# Patient Record
Sex: Male | Born: 1970
Health system: Southern US, Community
[De-identification: ages and names within clinical notes are randomized; demographics above are authoritative.]

## PROBLEM LIST (undated history)

## (undated) DIAGNOSIS — C4491 Basal cell carcinoma of skin, unspecified: Secondary | ICD-10-CM

## (undated) DIAGNOSIS — I10 Essential (primary) hypertension: Secondary | ICD-10-CM

## (undated) DIAGNOSIS — D039 Melanoma in situ, unspecified: Secondary | ICD-10-CM

## (undated) DIAGNOSIS — C4492 Squamous cell carcinoma of skin, unspecified: Secondary | ICD-10-CM

## (undated) DIAGNOSIS — Z8619 Personal history of other infectious and parasitic diseases: Secondary | ICD-10-CM

## (undated) HISTORY — DX: Personal history of other infectious and parasitic diseases: Z86.19

## (undated) HISTORY — DX: Melanoma in situ, unspecified: D03.9

## (undated) HISTORY — PX: WISDOM TOOTH EXTRACTION: SHX21

## (undated) HISTORY — PX: KNEE SURGERY: SHX244

## (undated) HISTORY — DX: Squamous cell carcinoma of skin, unspecified: C44.92

## (undated) HISTORY — PX: SHOULDER SURGERY: SHX246

## (undated) HISTORY — DX: Basal cell carcinoma of skin, unspecified: C44.91

## (undated) HISTORY — DX: Essential (primary) hypertension: I10

---

## 2012-09-05 ENCOUNTER — Encounter (HOSPITAL_COMMUNITY): Payer: Self-pay | Admitting: *Deleted

## 2012-09-05 ENCOUNTER — Emergency Department (HOSPITAL_COMMUNITY): Payer: BC Managed Care – PPO

## 2012-09-05 ENCOUNTER — Emergency Department (HOSPITAL_COMMUNITY)
Admission: EM | Admit: 2012-09-05 | Discharge: 2012-09-05 | Disposition: A | Discharge status: Home / Self Care | Payer: BC Managed Care – PPO | Attending: Emergency Medicine | Admitting: Emergency Medicine

## 2012-09-05 DIAGNOSIS — R42 Dizziness and giddiness: Secondary | ICD-10-CM | POA: Insufficient documentation

## 2012-09-05 DIAGNOSIS — R739 Hyperglycemia, unspecified: Secondary | ICD-10-CM

## 2012-09-05 DIAGNOSIS — R110 Nausea: Secondary | ICD-10-CM | POA: Insufficient documentation

## 2012-09-05 DIAGNOSIS — M549 Dorsalgia, unspecified: Secondary | ICD-10-CM | POA: Insufficient documentation

## 2012-09-05 DIAGNOSIS — R61 Generalized hyperhidrosis: Secondary | ICD-10-CM | POA: Insufficient documentation

## 2012-09-05 DIAGNOSIS — R093 Abnormal sputum: Secondary | ICD-10-CM | POA: Insufficient documentation

## 2012-09-05 DIAGNOSIS — R079 Chest pain, unspecified: Secondary | ICD-10-CM

## 2012-09-05 DIAGNOSIS — R05 Cough: Secondary | ICD-10-CM | POA: Insufficient documentation

## 2012-09-05 DIAGNOSIS — M542 Cervicalgia: Secondary | ICD-10-CM | POA: Insufficient documentation

## 2012-09-05 DIAGNOSIS — R059 Cough, unspecified: Secondary | ICD-10-CM | POA: Insufficient documentation

## 2012-09-05 DIAGNOSIS — R55 Syncope and collapse: Secondary | ICD-10-CM

## 2012-09-05 DIAGNOSIS — R209 Unspecified disturbances of skin sensation: Secondary | ICD-10-CM | POA: Insufficient documentation

## 2012-09-05 DIAGNOSIS — IMO0001 Reserved for inherently not codable concepts without codable children: Secondary | ICD-10-CM | POA: Insufficient documentation

## 2012-09-05 LAB — BASIC METABOLIC PANEL
BUN: 14 mg/dL (ref 6–23)
CO2: 23 mEq/L (ref 19–32)
Calcium: 9.8 mg/dL (ref 8.4–10.5)
Chloride: 102 mEq/L (ref 96–112)
Creatinine, Ser: 1.18 mg/dL (ref 0.50–1.35)
GFR calc Af Amer: 88 mL/min — ABNORMAL LOW (ref >90)
GFR calc non Af Amer: 76 mL/min — ABNORMAL LOW (ref >90)
Glucose, Bld: 199 mg/dL — ABNORMAL HIGH (ref 70–99)
Potassium: 3.6 mEq/L (ref 3.5–5.1)
Sodium: 137 mEq/L (ref 135–145)

## 2012-09-05 LAB — CBC WITH DIFFERENTIAL/PLATELET
Basophils Absolute: 0 10*3/uL (ref 0.0–0.1)
Basophils Relative: 0 % (ref 0–1)
Eosinophils Absolute: 0 10*3/uL (ref 0.0–0.7)
Eosinophils Relative: 0 % (ref 0–5)
HCT: 44.2 % (ref 39.0–52.0)
Hemoglobin: 15.2 g/dL (ref 13.0–17.0)
Lymphocytes Relative: 35 % (ref 12–46)
Lymphs Abs: 2.9 10*3/uL (ref 0.7–4.0)
MCH: 31.3 pg (ref 26.0–34.0)
MCHC: 34.4 g/dL (ref 30.0–36.0)
MCV: 90.9 fL (ref 78.0–100.0)
Monocytes Absolute: 0.4 10*3/uL (ref 0.1–1.0)
Monocytes Relative: 5 % (ref 3–12)
Neutro Abs: 4.9 10*3/uL (ref 1.7–7.7)
Neutrophils Relative %: 60 % (ref 43–77)
Platelets: 245 10*3/uL (ref 150–400)
RBC: 4.86 MIL/uL (ref 4.22–5.81)
RDW: 12.3 % (ref 11.5–15.5)
WBC: 8.3 10*3/uL (ref 4.0–10.5)

## 2012-09-05 LAB — TROPONIN I: Troponin I: <0.3 ng/mL (ref <0.30)

## 2012-09-05 MED ORDER — SODIUM CHLORIDE 0.9 % IV SOLN
INTRAVENOUS | Status: DC
  Administered 2012-09-05: 13:00:00 via INTRAVENOUS

## 2012-09-05 MED ORDER — SODIUM CHLORIDE 0.9 % IV BOLUS (SEPSIS)
500.0000 mL | Freq: Once | INTRAVENOUS | Status: AC
  Administered 2012-09-05: 1000 mL via INTRAVENOUS

## 2012-09-05 MED ORDER — ASPIRIN 81 MG PO CHEW
324.0000 mg | CHEWABLE_TABLET | Freq: Once | ORAL | Status: AC
  Administered 2012-09-05: 324 mg via ORAL
  Filled 2012-09-05: qty 4

## 2012-09-05 MED ORDER — ONDANSETRON HCL 4 MG/2ML IJ SOLN
4.0000 mg | Freq: Once | INTRAMUSCULAR | Status: AC
  Administered 2012-09-05: 4 mg via INTRAVENOUS
  Filled 2012-09-05: qty 2

## 2012-09-05 MED ORDER — IOHEXOL 350 MG/ML SOLN
100.0000 mL | Freq: Once | INTRAVENOUS | Status: AC | PRN
  Administered 2012-09-05: 100 mL via INTRAVENOUS

## 2012-09-05 NOTE — ED Notes (Signed)
Discharge instructions discussed with pt and wife, verbalized understanding of all, no scripts given. Ambulated out without difficulty.

## 2012-09-05 NOTE — ED Notes (Signed)
Pt reports near syncopal episode while at work today, thinks may be getting a chest cold, chest feels tight, ?chest pain at times, is pain free at arrival. Denies any sob, reports being under a lot of stress at work. No fever. Or cough. No dizziness or chills

## 2012-09-05 NOTE — ED Provider Notes (Addendum)
History    This chart was scribed for Shelda Jakes, MD by Sofie Rower, ED Scribe. The patient was seen in room APA02/AP[facility]02 and the patient's care was started at 11:15AM.    CSN: [id number]  Arrival date & time 09/05/12  1013   First MD Initiated Contact with Patient 09/05/12 1115      Chief Complaint  Patient presents with  . Chest Pain    (Consider location/radiation/quality/duration/timing/severity/associated sxs/prior treatment) Patient is a 42 y.o. male presenting with chest pain. The history is provided by the patient and the spouse. No language interpreter was used.  Chest Pain Pain location:  Substernal area and L chest Pain radiates to:  Upper back Pain radiates to the back: yes   Pain severity:  Moderate Onset quality:  Sudden Timing:  Intermittent Progression:  Worsening Chronicity:  New Relieved by:  Nothing Worsened by:  Nothing tried Ineffective treatments:  None tried Associated symptoms: back pain, cough, diaphoresis, nausea and numbness   Associated symptoms: no abdominal pain, no fever, no headache, no shortness of breath and not vomiting     Kurt Holmes is a 42 y.o. male , with a hx of shoulder and knee surgery, who presents to the Emergency Department complaining of intermittent, progressively worsening, chest pain located at the left sub sternal chest, radiating towards the back, onset yesterday (09/04/12)  Associated symptoms include light headedness, myalgias, back pain, neck pain, nausea, chills, sweats, tingling in the bilateral upper extremities, cough, and near syncope. The pt reports the chest pain he is experiencing is a feeling of discomfort, almost as if he has pulled a muscle. While on a ladder at work this morning, the pt suddenly became sick to his stomach, and almost passed out, which prompted his concern and desire to seek medical evaluation at [facility] this morning.   The pt denies headache, SOB, abdominal pain, vomiting, dysuria, fevers, and  rash. Furthremore, the pt denies any hx of bleeding easily.   The pt does not smoke or drink alcohol.     History reviewed. No pertinent past medical history.  Past Surgical History  Procedure Laterality Date  . Shoulder surgery    . Knee surgery      No family history on file.  History  Substance Use Topics  . Smoking status: Never Smoker   . Smokeless tobacco: Not on file  . Alcohol Use: No      Review of Systems  Constitutional: Positive for chills and diaphoresis. Negative for fever.  HENT: Positive for neck pain.   Eyes: Negative for visual disturbance.  Respiratory: Positive for cough. Negative for shortness of breath.   Cardiovascular: Positive for chest pain.  Gastrointestinal: Positive for nausea. Negative for vomiting and abdominal pain.  Genitourinary: Negative for dysuria.  Musculoskeletal: Positive for myalgias and back pain.  Skin: Negative for rash.  Neurological: Positive for syncope, light-headedness and numbness. Negative for headaches.  Hematological: Does not bruise/bleed easily.  All other systems reviewed and are negative.    Allergies  Review of patient's allergies indicates no known allergies.  Home Medications   Current Outpatient Rx  Name  Route  Sig  Dispense  Refill  . guaiFENesin-dextromethorphan (ROBITUSSIN DM) 100-10 MG/5ML syrup   Oral   Take 10 mLs by mouth 3 (three) times daily as needed for cough.           BP 137/87  Pulse 91  Temp(Src) 98.3 F (36.8 C) (Oral)  Resp 16  Ht 5\' 6"  (1.676  m)  Wt 170 lb (77.111 kg)  BMI 27.45 kg/m2  SpO2 96%  Physical Exam  Nursing note and vitals reviewed. Constitutional: He is oriented to person, place, and time. He appears well-developed and well-nourished. No distress.  HENT:  Head: Normocephalic and atraumatic.  Mouth/Throat: Oropharynx is clear and moist.  Eyes: Conjunctivae and EOM are normal. Pupils are equal, round, and reactive to light. No scleral icterus.  Neck: Neck  supple. No tracheal deviation present.  Cardiovascular: Normal rate, regular rhythm and normal heart sounds.   No murmur heard. Pulmonary/Chest: Effort normal and breath sounds normal. No respiratory distress. He has no wheezes.  Abdominal: Soft. Bowel sounds are normal. He exhibits no distension. There is no tenderness.  Musculoskeletal: Normal range of motion. He exhibits no edema.  Lymphadenopathy:    He has no cervical adenopathy.  Neurological: He is alert and oriented to person, place, and time. No cranial nerve deficit or sensory deficit.  Skin: Skin is warm and dry.  Psychiatric: He has a normal mood and affect. His behavior is normal.    ED Course  Procedures (including critical care time)  DIAGNOSTIC STUDIES: Oxygen Saturation is 96% on room air, normal by my interpretation.    COORDINATION OF CARE:   11:40 AM- Treatment plan discussed with patient. Pt agrees with treatment.     Results for orders placed during the hospital encounter of 09/05/12  CBC WITH DIFFERENTIAL      Result Value Range   WBC 8.3  4.0 - 10.5 K/uL   RBC 4.86  4.22 - 5.81 MIL/uL   Hemoglobin 15.2  13.0 - 17.0 g/dL   HCT 21.3  08.6 - 57.8 %   MCV 90.9  78.0 - 100.0 fL   MCH 31.3  26.0 - 34.0 pg   MCHC 34.4  30.0 - 36.0 g/dL   RDW 46.9  62.9 - 52.8 %   Platelets 245  150 - 400 K/uL   Neutrophils Relative 60  43 - 77 %   Neutro Abs 4.9  1.7 - 7.7 K/uL   Lymphocytes Relative 35  12 - 46 %   Lymphs Abs 2.9  0.7 - 4.0 K/uL   Monocytes Relative 5  3 - 12 %   Monocytes Absolute 0.4  0.1 - 1.0 K/uL   Eosinophils Relative 0  0 - 5 %   Eosinophils Absolute 0.0  0.0 - 0.7 K/uL   Basophils Relative 0  0 - 1 %   Basophils Absolute 0.0  0.0 - 0.1 K/uL  BASIC METABOLIC PANEL      Result Value Range   Sodium 137  135 - 145 mEq/L   Potassium 3.6  3.5 - 5.1 mEq/L   Chloride 102  96 - 112 mEq/L   CO2 23  19 - 32 mEq/L   Glucose, Bld 199 (*) 70 - 99 mg/dL   BUN 14  6 - 23 mg/dL   Creatinine, Ser 4.13   0.50 - 1.35 mg/dL   Calcium 9.8  8.4 - 24.4 mg/dL   GFR calc non Af Amer 76 (*) >90 mL/min   GFR calc Af Amer 88 (*) >90 mL/min  TROPONIN I      Result Value Range   Troponin I <0.30  <0.30 ng/mL   Dg Chest Portable 1 View  09/05/2012  *RADIOLOGY REPORT*  Clinical Data: Anterior and posterior chest pain, former smoking history  PORTABLE CHEST - 1 VIEW  Comparison: None.  Findings: No active infiltrate or effusion  is seen.  Mediastinal contours appear normal.  The heart is within normal limits in size. No skeletal abnormality is seen.  IMPRESSION: No active lung disease.   Original Report Authenticated By: Dwyane Dee, M.D.      Date: 09/05/2012  Rate: 91  Rhythm: normal sinus rhythm  QRS Axis: normal  Intervals: normal  ST/T Wave abnormalities: normal  Conduction Disutrbances:none  Narrative Interpretation:   Old EKG Reviewed: none available   1. Near syncope   2. Hyperglycemia   3. Chest pain       MDM   Workup for the near syncope and the intermittent chest pain mild since yesterday has been negative. With the exception that your blood sugar was elevated will need followup to make sure that this is not a borderline diabetic situation. CT chest negative for pulmonary embolism. Negative for pneumonia or pneumothorax. Recommend followup with cardiology referral provided. Also recommend starting a baby aspirin a day. Patient will return for any newer worse symptoms.      I personally performed the services described in this documentation, which was scribed in my presence. The recorded information has been reviewed and is accurate.     Shelda Jakes, MD 09/05/12 1341  Shelda Jakes, MD 09/05/12 1346  Shelda Jakes, MD 09/05/12 1416

## 2012-09-05 NOTE — ED Notes (Signed)
Pt given ice chips, requesting information about labs and  Ct results, md notified. Stated he will see pt asap.

## 2012-09-05 NOTE — ED Notes (Signed)
Pt states he was at work and became nauseated and began feeling like he was going to pass out. States everything went black but did not fall. Pain to center chest, radiating to back, described as "like a pulled muscle". NAD.

## 2012-09-05 NOTE — ED Notes (Signed)
Pt resting in bed, voices no complaints,

## 2014-05-28 IMAGING — CT CT ANGIO CHEST
1 of 6 series · 5 of 36 positions shown · IV contrast (Omnipaque 300)
Comparison: Chest x-ray earlier today.

CLINICAL DATA: Chest pain

CT ANGIOGRAPHY CHEST
TECHNIQUE: Multidetector CT imaging of the chest using the
standard protocol during bolus administration of intravenous
contrast. Multiplanar reconstructed images including MIPs were
obtained and reviewed to evaluate the vascular anatomy.
Contrast: 100mL OMNIPAQUE IOHEXOL 350 MG/ML SOLN

[Series 4: pe 3.0 b40f · axial · 0.67mm/px · z∈[-164,-26]mm · 5 of 70 slices shown]
[im 12/70  lung]
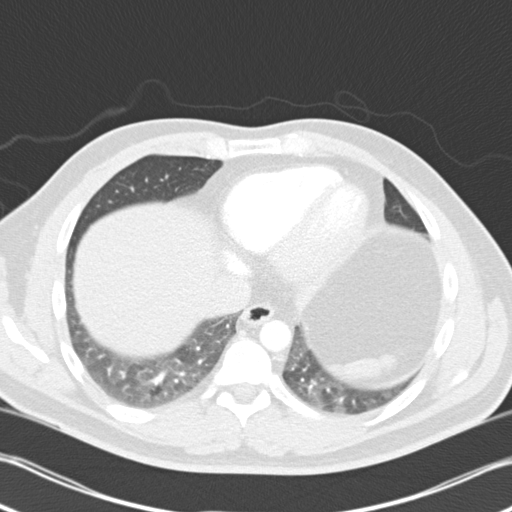
[im 24/70  mediastinal]
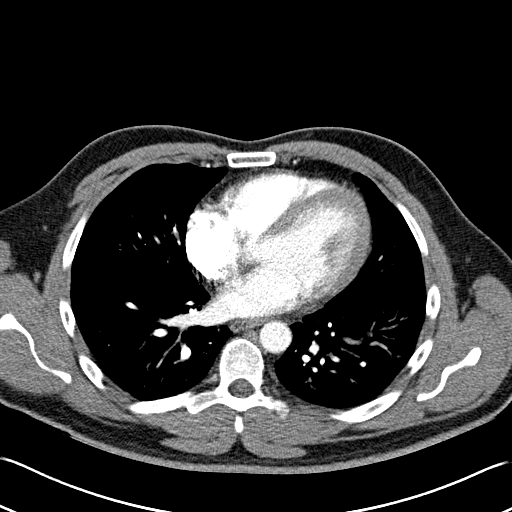
[im 35/70  lung]
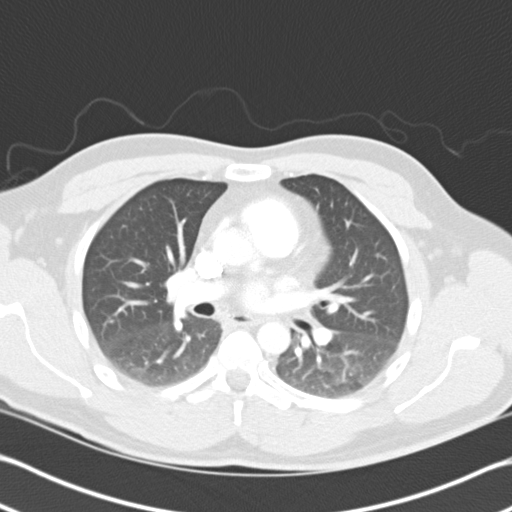
[im 47/70  mediastinal]
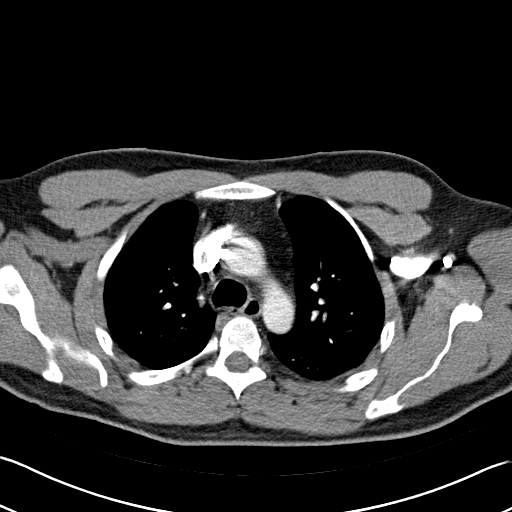
[im 58/70  lung]
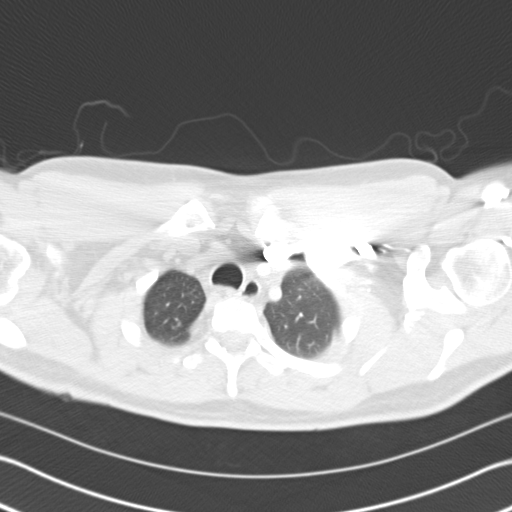

[5 of 36 positions shown; findings below may reference images not displayed]

FINDINGS: No filling defects in the pulmonary arteries to suggest
pulmonary emboli. Heart is normal size. Aorta is normal caliber. No
mediastinal, hilar, or axillary adenopathy.  Visualized thyroid and
chest wall soft tissues unremarkable.

Minimal dependent ground-glass opacities in the lungs, likely
dependent atelectasis.  No pleural effusions.

Imaging into the upper abdomen shows no acute findings.  No acute
bony abnormality.
IMPRESSION: No evidence of pulmonary embolus.  No acute findings.

## 2014-10-24 ENCOUNTER — Telehealth: Payer: Self-pay | Admitting: Family Medicine

## 2014-10-24 NOTE — Telephone Encounter (Signed)
Please find a 30 min. Spot for patient

## 2014-10-24 NOTE — Telephone Encounter (Signed)
30 min appointment, when possible.  Thanks.

## 2014-10-24 NOTE — Telephone Encounter (Signed)
Patient has a new patient appointment on 10/28/14.  Patient has to go to a mandatory class in [place] for his job.  Patient has been waiting several months for this appointment and wants to know if he can be scheduled on another day.

## 2014-10-28 ENCOUNTER — Ambulatory Visit: Payer: BC Managed Care – PPO | Admitting: Family Medicine

## 2014-10-29 ENCOUNTER — Encounter: Payer: Self-pay | Admitting: Family Medicine

## 2014-10-29 ENCOUNTER — Ambulatory Visit (INDEPENDENT_AMBULATORY_CARE_PROVIDER_SITE_OTHER): Payer: BLUE CROSS/BLUE SHIELD | Admitting: Family Medicine

## 2014-10-29 VITALS — BP 110/70 | HR 84 | Temp 98.4°F | Ht 65.75 in | Wt 163.5 lb

## 2014-10-29 DIAGNOSIS — Z8639 Personal history of other endocrine, nutritional and metabolic disease: Secondary | ICD-10-CM | POA: Diagnosis not present

## 2014-10-29 DIAGNOSIS — Z23 Encounter for immunization: Secondary | ICD-10-CM

## 2014-10-29 DIAGNOSIS — R55 Syncope and collapse: Secondary | ICD-10-CM | POA: Diagnosis not present

## 2014-10-29 DIAGNOSIS — R739 Hyperglycemia, unspecified: Secondary | ICD-10-CM | POA: Diagnosis not present

## 2014-10-29 DIAGNOSIS — Z13220 Encounter for screening for lipoid disorders: Secondary | ICD-10-CM

## 2014-10-29 NOTE — Patient Instructions (Signed)
Get a fasting lab appointment on the way out.  We'll contact you with your lab report afterward.  Keep packing your lunch and working on your diet.  I would get a flu shot each fall.   Take care.  Glad to see you.

## 2014-10-30 ENCOUNTER — Encounter: Payer: Self-pay | Admitting: Family Medicine

## 2014-10-30 DIAGNOSIS — R55 Syncope and collapse: Secondary | ICD-10-CM | POA: Insufficient documentation

## 2014-10-30 DIAGNOSIS — R739 Hyperglycemia, unspecified: Secondary | ICD-10-CM | POA: Insufficient documentation

## 2014-10-30 DIAGNOSIS — Z13220 Encounter for screening for lipoid disorders: Secondary | ICD-10-CM | POA: Insufficient documentation

## 2014-10-30 NOTE — Assessment & Plan Note (Signed)
Was likely up from eating candy after the event but just before lab draw at ER.  Return for fasting labs.   He agrees.

## 2014-10-30 NOTE — Assessment & Plan Note (Signed)
Return for fasting labs.

## 2014-10-30 NOTE — Progress Notes (Signed)
New patient.  Needs tdap updated.    H/o syncope/near syncope last year.  CT chest neg, EKG wnl.  Labs okay at ER except for high sugar.  No dx DM2.  He had been eating candy before the ER eval but after the event, since it was unknown if he had sx from possible hypoglycemia.  At the time, he was in a high stress situation, wasn't sleeping well and was fasting.  No CP, SOB, BLE edema.  No sx in the meantime.  Good exercise tolerance at work.  Due for f/u labs re: sugar.    Due for lipid screening.    PMH and SH reviewed  ROS: See HPI, otherwise noncontributory.  Meds, vitals, and allergies reviewed.   GEN: nad, alert and oriented HEENT: mucous membranes moist NECK: supple w/o LA CV: rrr.  no murmur PULM: ctab, no inc wob ABD: soft, +bs EXT: no edema SKIN: no acute rash

## 2014-10-30 NOTE — Assessment & Plan Note (Signed)
2015, without sx again in the meantime.  Wouldn't w/u further other then recheck sugar, d/w pt.

## 2015-05-28 ENCOUNTER — Encounter: Payer: Self-pay | Admitting: Family Medicine

## 2015-05-28 ENCOUNTER — Ambulatory Visit (INDEPENDENT_AMBULATORY_CARE_PROVIDER_SITE_OTHER): Payer: BLUE CROSS/BLUE SHIELD | Admitting: Family Medicine

## 2015-05-28 ENCOUNTER — Telehealth: Payer: Self-pay | Admitting: Family Medicine

## 2015-05-28 VITALS — BP 120/70 | HR 56 | Temp 97.5°F | Wt 171.0 lb

## 2015-05-28 DIAGNOSIS — Z8489 Family history of other specified conditions: Secondary | ICD-10-CM

## 2015-05-28 DIAGNOSIS — R0683 Snoring: Secondary | ICD-10-CM

## 2015-05-28 DIAGNOSIS — Z87898 Personal history of other specified conditions: Secondary | ICD-10-CM

## 2015-05-28 NOTE — Telephone Encounter (Signed)
PLEASE NOTE: All timestamps contained within this report are represented as [place] Standard Time. CONFIDENTIALTY NOTICE: This fax transmission is intended only for the addressee. It contains information that is legally privileged, confidential or otherwise protected from use or disclosure. If you are not the intended recipient, you are strictly prohibited from reviewing, disclosing, copying using or disseminating any of this information or taking any action in reliance on or regarding this information. If you have received this fax in error, please notify us immediately by telephone so that we can arrange for its return to [place]. Phone: [phone], Toll-Free: [phone], Fax: [phone] Page: 1 of 2 Call Id: [id number] [place] Patient Name: Kurt Holmes Gender: Male DOB: 09-02-1970 Age: 44 Y 13 M 20 D Return Phone Number: [phone] (Primary) Address: City/State/Zip: [place] Client [facility] Client Client Site [place] - Day Physician Renford Dills Contact Type Call Call Type Triage / Clinical Relationship To Patient Self Appointment Disposition EMR Appointment Scheduled Info pasted into Epic Yes Return Phone Number [phone] (Primary) Chief Complaint Blood Pressure High Initial Comment Caller states is not feeling to well; BP is fluctuating currently 124/85 PreDisposition Go to Urgent Care/Walk-In Clinic Nurse Assessment Nurse: Markus Daft, RN, Sherre Poot Date/Time (Eastern Time): 05/28/2015 10:09:33 AM Confirm and document reason for call. If symptomatic, describe symptoms. ---Caller states is not feeling to well with lightheadedness, jittery, moving slow, hands cold this AM. Feeling heart beat in his head - not now. He did have HA with that, but it is going away. BP is fluctuating currently 124/85 this am about 30 min. ago. He had  a stressful AM. He waited an hour after he cooled down to check this BP. The s/s are passing now, and fatigue persists. He has not had breakfast yet. Has the patient traveled out of the country within the last 30 days? ---Not Applicable Does the patient have any new or worsening symptoms? ---Yes Will a triage be completed? ---Yes Related visit to physician within the last 2 weeks? ---No Does the PT have any chronic conditions? (i.e. diabetes, asthma, etc.) ---No Guidelines Guideline Title Affirmed Question Affirmed Notes Nurse Date/Time (Eastern Time) High Blood Pressure Wants doctor to measure BP Markus Daft, RN, [place] 05/28/2015 10:14:55 AM Disp. Time Eilene Ghazi Time) Disposition Final User 05/28/2015 10:17:49 AM See Physician within 24 Hours Yes Markus Daft, RN, Sherre Poot Disposition Overriden: See PCP within 2 Weeks Override Reason: Patient's symptoms have resolved during call Caller Understands: Yes PLEASE NOTE: All timestamps contained within this report are represented as [place] Standard Time. CONFIDENTIALTY NOTICE: This fax transmission is intended only for the addressee. It contains information that is legally privileged, confidential or otherwise protected from use or disclosure. If you are not the intended recipient, you are strictly prohibited from reviewing, disclosing, copying using or disseminating any of this information or taking any action in reliance on or regarding this information. If you have received this fax in error, please notify us immediately by telephone so that we can arrange for its return to [place]. Phone: [phone], Toll-Free: [phone], Fax: [phone] Page: 2 of 2 Call Id: [id number] Disagree/Comply: Comply Care Advice Given Per Guideline SEE PCP WITHIN 2 WEEKS: You need an evaluation for this ongoing problem within the next 2 weeks. Call your doctor during regular office hours and make an appointment. HIGH BLOOD PRESSURE: * The goal of blood pressure treatment  for most people with hypertension  is to keep the blood pressure under 140/90. For people that are 60 years or older, your doctor may instead want to keep the blood pressure under 150/90. CALL BACK IF: * Weakness or numbness of the face, arm or leg on one side of the body occurs * Difficulty walking, difficulty talking, or severe headache occurs * Chest pain or difficulty breathing occurs * Your blood pressure is over 160/100 * You become worse. CARE ADVICE given per High Blood Pressure (Adult) guideline. SEE PHYSICIAN WITHIN 24 HOURS: After Care Instructions Given Call Event Type User Date / Time Description Comments User: Mayford Knife, RN Date/Time Eilene Ghazi Time): 05/28/2015 10:23:33 AM RN upgraded call as pt describes vague s/s and feeling off despite the HA and dizziness going away. He had some cold almost numb feeling in the right hand, not now. He states that his walking isn't unsteady, but it is not right. -- RN made appt with Dr. Damita Dunnings for today to get his BP checked by MD, and f/u on s/s. RN reassured that his BP is not too high, but can confirm with MD. Referrals REFERRED TO PCP OFFICE

## 2015-05-28 NOTE — Telephone Encounter (Signed)
[facility] Patient Name: Kurt Holmes DOB: 1971-04-06 Initial Comment Caller states is not feeling to well; BP is fluctuating currently 124/85 Nurse Assessment Nurse: Markus Daft, RN, [place] Date/Time (Eastern Time): 05/28/2015 10:09:33 AM Confirm and document reason for call. If symptomatic, describe symptoms. ---Caller states is not feeling to well with lightheadedness, jittery, moving slow, hands cold this AM. Feeling heart beat in his head - not now. He did have HA with that, but it is going away. BP is fluctuating currently 124/85 this am about 30 min. ago. He had a stressful AM. He waited an hour after he cooled down to check this BP. The s/s are passing now, and fatigue persists. He has not had breakfast yet. Has the patient traveled out of the country within the last 30 days? ---Not Applicable Does the patient have any new or worsening symptoms? ---Yes Will a triage be completed? ---Yes Related visit to physician within the last 2 weeks? ---No Does the PT have any chronic conditions? (i.e. diabetes, asthma, etc.) ---No Guidelines Guideline Title Affirmed Question Affirmed Notes High Blood Pressure Wants doctor to measure BP Final Disposition User See Physician within [place], RN, [place] Comments RN upgraded call as pt describes vague s/s and feeling off despite the HA and dizziness going away. He had some cold almost numb feeling in the right hand, not now. He states that his walking isn't unsteady, but it is not right. -- RN made appt with Dr. Damita Dunnings for today to get his BP checked by MD, and f/u on s/s. RN reassured that his BP is not too high, but can confirm with MD. Referrals REFERRED TO PCP OFFICE Disagree/Comply: Comply

## 2015-05-28 NOTE — Progress Notes (Signed)
Pre visit review using our clinic review tool, if applicable. No additional management support is needed unless otherwise documented below in the visit note.  He had B occ floaters.  No vision loss.  Due for an eye exam, d/w pt.   Fatigued.  Noted recently.  Not sleeping well.  More snoring, more talking in his sleep, had been grinding his teeth at night.  Not waking gasping for air.  Longstanding snoring.  He was aggravated this AM (son's grades were not good).  "I think I'm not letting stuff go."  He didn't feel well (he had a throbbing in his head) and checked his BP.  130s/85 on check this AM (this was after he had "cooled down" some).  His L hand was transiently cold feeling this AM.  Resolved now; unclear if from clenching the steering wheel.  No SI/HI.    47mm x45mm blanching lesion on the nose.  Not ulcerated. Present for about 1 year.    Meds, vitals, and allergies reviewed.   ROS: See HPI.  Otherwise, noncontributory.  GEN: nad, alert and oriented HEENT: mucous membranes moist NECK: supple w/o LA CV: rrr. PULM: ctab, no inc wob ABD: soft, +bs EXT: no edema SKIN: no acute rash but 23mm x18mm blanching macule on the nose

## 2015-05-28 NOTE — Patient Instructions (Signed)
Rosaria Ferries will call about your referral. If the spot on your nose doesn't go away, then we can work on it after you see pulmonary.  Take care.  Glad to see you.

## 2015-05-28 NOTE — Telephone Encounter (Signed)
Pt has appt 05/28/15 at 11:45 with Dr Damita Dunnings.

## 2015-05-29 DIAGNOSIS — R0683 Snoring: Secondary | ICD-10-CM | POA: Insufficient documentation

## 2015-05-29 NOTE — Assessment & Plan Note (Signed)
This may be excessive, he may have sleep apnea, and this may explain his fatigue/mood/irritability.  Unclear if he had true BP elevation today, OSA could also affect this.  All d/w pt.  Would refer for testing.  The skin lesion on his nose isn't emergent and I don't want to do anything to this given the location may interfere with OSA testing and that is more important now.  He agrees.  He'll update me.

## 2015-07-14 ENCOUNTER — Ambulatory Visit (INDEPENDENT_AMBULATORY_CARE_PROVIDER_SITE_OTHER): Payer: BLUE CROSS/BLUE SHIELD | Admitting: Internal Medicine

## 2015-07-14 ENCOUNTER — Encounter: Payer: Self-pay | Admitting: Internal Medicine

## 2015-07-14 ENCOUNTER — Encounter (INDEPENDENT_AMBULATORY_CARE_PROVIDER_SITE_OTHER): Payer: Self-pay

## 2015-07-14 VITALS — BP 118/62 | HR 80 | Ht 66.0 in | Wt 169.6 lb

## 2015-07-14 DIAGNOSIS — R0683 Snoring: Secondary | ICD-10-CM | POA: Diagnosis not present

## 2015-07-14 DIAGNOSIS — G4719 Other hypersomnia: Secondary | ICD-10-CM | POA: Diagnosis not present

## 2015-07-14 NOTE — Progress Notes (Signed)
[facility] Consultation      Assessment and Plan:  Excessive daytime sleepiness. -Symptoms and signs of obstructive sleep apnea. Will send the patient for sleep study.  Snoring. -Possibly secondary to above.  Sleep Maintenance Insomnia.  -May be secondary to sleep apnea, or may be independent of it. We discussed possibility that this may be related to sleep apnea and we'll reevaluate if and when he is started on CPAP.     Date: 07/14/2015  MRN# [id number] Kurt Holmes 1970/07/22  Referring Physician: Dr. Damita Dunnings.   Kurt Holmes is a 44 y.o. old male seen in consultation for chief complaint of:    Chief Complaint  Patient presents with  . SLEEP CONSULT    ref. by dr. Damita Dunnings for fatigued and snoring. pt. states wife says he say loud snoring. increased daytime sleeping. feels he has no engery.    HPI:   He notes that over the past year he has been tired all of the time, he has trouble staying asleep at night. Goes to bed at 930 to 10 pm, he wakes up often, usually after midnight.  He notes that he begins to feel tired after lunch and has trouble staying awake when sitting idle.  His wife notes that he snores no witness apneas.   Epworth score is 12.   PMHX:   Past Medical History  Diagnosis Date  . History of chicken pox    Surgical Hx:  Past Surgical History  Procedure Laterality Date  . Shoulder surgery    . Knee surgery    . Wisdom tooth extraction     Family Hx:  Family History  Problem Relation Age of Onset  . Hypertension Mother   . Dementia Father   . Colon cancer Neg Hx   . Prostate cancer Neg Hx    Social Hx:   Social History  Substance Use Topics  . Smoking status: Former Research scientist (life sciences)  . Smokeless tobacco: Former Systems developer     Comment: in the teen years and twenties  . Alcohol Use: No   Medication:   No current outpatient prescriptions on file.    Allergies:  Review of patient's allergies indicates no known allergies.  Review  of Systems: Gen:  Denies  fever, sweats, chills HEENT: Denies blurred vision, double vision. bleeds, sore throat Cvc:  No dizziness, chest pain. Resp:   Denies cough or sputum porduction, shortness of breath Gi: Denies swallowing difficulty, stomach pain. Gu:  Denies bladder incontinence, burning urine Ext:   No Joint pain, stiffness. Skin: No skin rash,  hives Endoc:  No polyuria, polydipsia. Psych: No depression, insomnia. Other:  All other systems were reviewed with the patient and were negative other that what is mentioned in the HPI.   Physical Examination:   VS: BP 118/62 mmHg  Pulse 80  Ht 5\' 6"  (1.676 m)  Wt 169 lb 9.6 oz (76.93 kg)  BMI 27.39 kg/m2  SpO2 99%  General Appearance: No distress  Neuro:without focal findings,  speech normal,  HEENT: PERRLA, EOM intact.   Pulmonary: normal breath sounds, No wheezing.  CardiovascularNormal S1,S2.  No m/r/g.   Abdomen: Benign, Soft, non-tender. Renal:  No costovertebral tenderness  GU:  No performed at this time. Endoc: No evident thyromegaly, no signs of acromegaly. Skin:   warm, no rashes, no ecchymosis  Extremities: normal, no cyanosis, clubbing.  Other findings:    LABORATORY PANEL:   CBC No results for input(s): WBC, HGB, HCT, PLT in the last  168 hours. ------------------------------------------------------------------------------------------------------------------  Chemistries  No results for input(s): NA, K, CL, CO2, GLUCOSE, BUN, CREATININE, CALCIUM, MG, AST, ALT, ALKPHOS, BILITOT in the last 168 hours.  Invalid input(s): GFRCGP ------------------------------------------------------------------------------------------------------------------  Cardiac Enzymes No results for input(s): TROPONINI in the last 168 hours. ------------------------------------------------------------  RADIOLOGY:  No results found.     Thank  you for the consultation and for allowing [facility] to  assist in the care of your patient. Our recommendations are noted above.  Please contact us if we can be of further service.   Marda Stalker, MD.  Board Certified in Internal Medicine, Pulmonary Medicine, [place], and Sleep Medicine.  [facility]  Patricia Pesa, M.D.  Vilinda Boehringer, M.D.  Merton Border, M.D

## 2015-07-14 NOTE — Patient Instructions (Addendum)
--  sleep study.    Sleep Apnea Sleep apnea is disorder that affects a person's sleep. A person with sleep apnea has abnormal pauses in their breathing when they sleep. It is hard for them to get a good sleep. This makes a person tired during the day. It also can lead to other physical problems. There are three types of sleep apnea. One type is when breathing stops for a short time because your airway is blocked (obstructive sleep apnea). Another type is when the brain sometimes fails to give the normal signal to breathe to the muscles that control your breathing (central sleep apnea). The third type is a combination of the other two types. HOME CARE   Take all medicine as told by your doctor.  Avoid alcohol, calming medicines (sedatives), and depressant drugs.  Try to lose weight if you are overweight. Talk to your doctor about a healthy weight goal.  Your doctor may have you use a device that helps to open your airway. It can help you get the air that you need. It is called a positive airway pressure (PAP) device.   MAKE SURE YOU:   Understand these instructions.  Will watch your condition.  Will get help right away if you are not doing well or get worse.  It may take approximately 1 month for you to get used to wearing CPAP every night.

## 2015-07-31 ENCOUNTER — Ambulatory Visit: Payer: BLUE CROSS/BLUE SHIELD | Attending: Pulmonary Disease

## 2015-07-31 DIAGNOSIS — R0683 Snoring: Secondary | ICD-10-CM | POA: Diagnosis present

## 2015-07-31 DIAGNOSIS — G4733 Obstructive sleep apnea (adult) (pediatric): Secondary | ICD-10-CM | POA: Diagnosis not present

## 2015-07-31 DIAGNOSIS — G4719 Other hypersomnia: Secondary | ICD-10-CM | POA: Diagnosis present

## 2015-08-12 DIAGNOSIS — G4733 Obstructive sleep apnea (adult) (pediatric): Secondary | ICD-10-CM | POA: Diagnosis not present

## 2015-08-13 ENCOUNTER — Telehealth: Payer: Self-pay | Admitting: *Deleted

## 2015-08-13 DIAGNOSIS — G4733 Obstructive sleep apnea (adult) (pediatric): Secondary | ICD-10-CM

## 2015-08-13 NOTE — Telephone Encounter (Signed)
Pt informed of CPAP titration being ordered.

## 2015-08-26 ENCOUNTER — Ambulatory Visit: Payer: BLUE CROSS/BLUE SHIELD | Attending: Pulmonary Disease

## 2015-09-01 DIAGNOSIS — G4733 Obstructive sleep apnea (adult) (pediatric): Secondary | ICD-10-CM | POA: Diagnosis not present

## 2015-09-02 ENCOUNTER — Telehealth: Payer: Self-pay | Admitting: *Deleted

## 2015-09-02 DIAGNOSIS — G4733 Obstructive sleep apnea (adult) (pediatric): Secondary | ICD-10-CM

## 2015-09-02 NOTE — Telephone Encounter (Signed)
Pt informed order for CPAP will be placed.

## 2015-10-13 DIAGNOSIS — G4733 Obstructive sleep apnea (adult) (pediatric): Secondary | ICD-10-CM | POA: Diagnosis not present

## 2015-10-16 ENCOUNTER — Ambulatory Visit: Payer: BLUE CROSS/BLUE SHIELD | Admitting: Internal Medicine

## 2015-11-13 DIAGNOSIS — G4733 Obstructive sleep apnea (adult) (pediatric): Secondary | ICD-10-CM | POA: Diagnosis not present

## 2015-11-27 ENCOUNTER — Ambulatory Visit: Payer: BLUE CROSS/BLUE SHIELD | Admitting: Internal Medicine

## 2015-12-04 ENCOUNTER — Encounter: Payer: BLUE CROSS/BLUE SHIELD | Admitting: Internal Medicine

## 2015-12-04 NOTE — Progress Notes (Signed)
*   [facility]     Assessment and Plan:  Obstructive sleep apnea. -Moderate obstructive sleep apnea with an apnea-hypopnea index of 16. Appears to be adequately controlled on CPAP of 10, to continue.  Excessive daytime sleepiness. -We'll arousals seen during study, many of which were spontaneous in origin.  Sleep maintenance insomnia. -If improved while on CPAP. No further treatment is required. Otherwise, can consider primary pharmacological treatment.  Date: 12/04/2015  MRN# [id number] Kurt Holmes 23-Jan-1971   Kurt Holmes is a 45 y.o. old male seen in follow up for chief complaint of  No chief complaint on file.    HPI:   The patient is a 45 year old male, at last visit. He was noted to have excessive daytime sleepiness, snoring, sleep maintenance insomnia. He was sent for a sleep study. Review of sleep study tracings from study on 07/31/2015: Latency was 33 minutes, sleep efficiency was 74%. AHI was 16, consistent with moderate OSA. The patient was  sent for a CPAP titration study on 08/26/2015. This showed AHI reduced to 0 at CPAP of 10. Arousal index remained between 5 and 10 at all levels of CPAP. The limb movement index was 6.6, PLMS index 0. Most arousals appeared to be spontaneous.  Medication:   No outpatient encounter prescriptions on file as of 12/04/2015.   No facility-administered encounter medications on file as of 12/04/2015.     Allergies:  Review of patient's allergies indicates no known allergies.  Review of Systems: Gen:  Denies  fever, sweats. HEENT: Denies blurred vision. Cvc:  No dizziness, chest pain or heaviness Resp:   Denies cough or sputum porduction. Gi: Denies swallowing difficulty, stomach pain. constipation, bowel incontinence Gu:  Denies bladder incontinence, burning urine Ext:   No Joint pain, stiffness. Skin: No skin rash, easy bruising. Endoc:  No polyuria, polydipsia. Psych: No depression, insomnia. Other:  All  other systems were reviewed and found to be negative other than what is mentioned in the HPI.   Physical Examination:   VS: There were no vitals taken for this visit.  General Appearance: No distress  Neuro:without focal findings,  speech normal,  HEENT: PERRLA, EOM intact. Pulmonary: normal breath sounds, No wheezing.   CardiovascularNormal S1,S2.  No m/r/g.   Abdomen: Benign, Soft, non-tender. Renal:  No costovertebral tenderness  GU:  Not performed at this time. Endoc: No evident thyromegaly, no signs of acromegaly. Skin:   warm, no rash. Extremities: normal, no cyanosis, clubbing.   LABORATORY PANEL:   CBC No results for input(s): WBC, HGB, HCT, PLT in the last 168 hours. ------------------------------------------------------------------------------------------------------------------  Chemistries  No results for input(s): NA, K, CL, CO2, GLUCOSE, BUN, CREATININE, CALCIUM, MG, AST, ALT, ALKPHOS, BILITOT in the last 168 hours.  Invalid input(s): GFRCGP ------------------------------------------------------------------------------------------------------------------  Cardiac Enzymes No results for input(s): TROPONINI in the last 168 hours. ------------------------------------------------------------  RADIOLOGY:   No results found for this or any previous visit. No results found for this or any previous visit. ------------------------------------------------------------------------------------------------------------------  Thank  you for allowing [facility] to assist in the care of your patient. Our recommendations are noted above.  Please contact us if we can be of further service.   Marda Stalker, MD.  [facility] Office Number: [phone]  Patricia Pesa, M.D.  Vilinda Boehringer, M.D.  Merton Border, M.D  12/04/2015   This encounter was created in error - please disregard.

## 2015-12-06 ENCOUNTER — Encounter: Payer: Self-pay | Admitting: Internal Medicine

## 2015-12-07 ENCOUNTER — Ambulatory Visit (INDEPENDENT_AMBULATORY_CARE_PROVIDER_SITE_OTHER): Payer: BLUE CROSS/BLUE SHIELD | Admitting: Internal Medicine

## 2015-12-07 ENCOUNTER — Encounter: Payer: Self-pay | Admitting: Internal Medicine

## 2015-12-07 VITALS — BP 122/74 | HR 99 | Ht 66.0 in | Wt 176.0 lb

## 2015-12-07 DIAGNOSIS — G4733 Obstructive sleep apnea (adult) (pediatric): Secondary | ICD-10-CM | POA: Diagnosis not present

## 2015-12-07 NOTE — Progress Notes (Signed)
* [facility]     Assessment and Plan:  Obstructive sleep apnea. -AHI of 16, adequately treated on CPAP at 10. -Continue use of nasal pillows, we discussed possibility of potentially switching to a nasal mask. Given his beard, full facial mask may be problematic weeks.  Excessive daytime sleepiness. -Improved on CPAP.  Snoring. -Resolved on CPAP.   Date: 12/07/2015  MRN# [id number] Bryer Rolli 01-Dec-1970   Yehia Sester is a 45 y.o. old male seen in follow up for chief complaint of  Chief Complaint  Patient presents with  . Follow-up     HPI:   The patient is a 45 year old male, at last visit he was noted to have excessive daytime sleepiness, snoring, sleep maintenance insomnia. He was sent for a sleep study. Review of sleep study tracings from study on 07/31/2015: Latency was 33 minutes, sleep efficiency was 74%. AHI was 16, consistent with moderate OSA. The patient was supposedly sent for a CPAP titration study on 08/26/2015. This showed AHI reduced to 0 at CPAP of 10. Arousal index remained between 5 and 10 at all levels of CPAP. The limb movement index was 6.6, PLMS index 0. Most arousals appeared to be spontaneous.  Since his last visit he has been doing well, he appears to be using his CPAP nightly, he is tolerating it well. He's having no symptoms of residual daytime sleepiness, his snoring has resolved.He was having some trouble while tolerating the nasal pillows, and switch from a small to medium which she is to be better tolerated.  Review of the CPAP download data: -CPAP set at 10, residual AHI is 1.7, average usage is 7 hours 41 minutes.  Medication:   No outpatient encounter prescriptions on file as of 12/07/2015.   No facility-administered encounter medications on file as of 12/07/2015.     Allergies:  Review of patient's allergies indicates no known allergies.  Review of Systems: Gen:  Denies  fever, sweats. HEENT: Denies blurred  vision. Cvc:  No dizziness, chest pain or heaviness Resp:   Denies cough or sputum porduction. Gi: Denies swallowing difficulty, stomach pain.  Gu:  Denies bladder incontinence, burning urine Ext:   No Joint pain, stiffness. Skin: No skin rash, easy bruising. Endoc:  No polyuria, polydipsia. Psych: No depression, insomnia. Other:  All other systems were reviewed and found to be negative other than what is mentioned in the HPI.   Physical Examination:   VS: BP 122/74 mmHg  Pulse 99  Ht 5\' 6"  (1.676 m)  Wt 176 lb (79.833 kg)  BMI 28.42 kg/m2  SpO2 96%  General Appearance: No distress  Neuro:without focal findings,  speech normal,  HEENT: PERRLA, EOM intact. Pulmonary: normal breath sounds, No wheezing.   CardiovascularNormal S1,S2.  No m/r/g.   Abdomen: Benign, Soft, non-tender. Renal:  No costovertebral tenderness  GU:  Not performed at this time. Endoc: No evident thyromegaly, no signs of acromegaly. Skin:   warm, no rash. Extremities: normal, no cyanosis, clubbing.   LABORATORY PANEL:   CBC No results for input(s): WBC, HGB, HCT, PLT in the last 168 hours. ------------------------------------------------------------------------------------------------------------------  Chemistries  No results for input(s): NA, K, CL, CO2, GLUCOSE, BUN, CREATININE, CALCIUM, MG, AST, ALT, ALKPHOS, BILITOT in the last 168 hours.  Invalid input(s): GFRCGP ------------------------------------------------------------------------------------------------------------------  Cardiac Enzymes No results for input(s): TROPONINI in the last 168 hours. ------------------------------------------------------------  RADIOLOGY:   No results found for this or any previous visit. No results found for this or any previous visit. ------------------------------------------------------------------------------------------------------------------  Thank  you for allowing [facility] to assist in the care of your patient. Our recommendations are noted above.  Please contact us if we can be of further service.   Marda Stalker, MD.  [facility] Office Number: [phone]  Patricia Pesa, M.D.  Vilinda Boehringer, M.D.  Merton Border, M.D  12/07/2015

## 2015-12-07 NOTE — Addendum Note (Signed)
Addended by: Maryanna Shape A on: 12/07/2015 09:51 AM   Modules accepted: Orders

## 2015-12-07 NOTE — Patient Instructions (Signed)
Continue to use CPAP every night.   Call us or your equipment provider if you are having any trouble with your sleep, or your machine.

## 2015-12-13 DIAGNOSIS — G4733 Obstructive sleep apnea (adult) (pediatric): Secondary | ICD-10-CM | POA: Diagnosis not present

## 2015-12-17 DIAGNOSIS — G4733 Obstructive sleep apnea (adult) (pediatric): Secondary | ICD-10-CM | POA: Diagnosis not present

## 2016-01-13 DIAGNOSIS — G4733 Obstructive sleep apnea (adult) (pediatric): Secondary | ICD-10-CM | POA: Diagnosis not present

## 2016-02-12 DIAGNOSIS — G4733 Obstructive sleep apnea (adult) (pediatric): Secondary | ICD-10-CM | POA: Diagnosis not present

## 2016-03-14 DIAGNOSIS — G4733 Obstructive sleep apnea (adult) (pediatric): Secondary | ICD-10-CM | POA: Diagnosis not present

## 2016-04-14 DIAGNOSIS — G4733 Obstructive sleep apnea (adult) (pediatric): Secondary | ICD-10-CM | POA: Diagnosis not present

## 2016-09-15 DIAGNOSIS — L218 Other seborrheic dermatitis: Secondary | ICD-10-CM | POA: Diagnosis not present

## 2016-09-15 DIAGNOSIS — L570 Actinic keratosis: Secondary | ICD-10-CM | POA: Diagnosis not present

## 2016-11-15 ENCOUNTER — Encounter: Payer: Self-pay | Admitting: Family Medicine

## 2016-11-15 ENCOUNTER — Ambulatory Visit (INDEPENDENT_AMBULATORY_CARE_PROVIDER_SITE_OTHER): Payer: BLUE CROSS/BLUE SHIELD | Admitting: Family Medicine

## 2016-11-15 VITALS — BP 128/84 | HR 81 | Temp 98.1°F | Wt 179.5 lb

## 2016-11-15 DIAGNOSIS — R030 Elevated blood-pressure reading, without diagnosis of hypertension: Secondary | ICD-10-CM | POA: Diagnosis not present

## 2016-11-15 DIAGNOSIS — R0789 Other chest pain: Secondary | ICD-10-CM | POA: Diagnosis not present

## 2016-11-15 DIAGNOSIS — H43393 Other vitreous opacities, bilateral: Secondary | ICD-10-CM

## 2016-11-15 DIAGNOSIS — H43399 Other vitreous opacities, unspecified eye: Secondary | ICD-10-CM | POA: Insufficient documentation

## 2016-11-15 NOTE — Assessment & Plan Note (Signed)
Reviewed healthy diet and lifestyle changes to keep blood pressure under good control. See pt instructions.

## 2016-11-15 NOTE — Assessment & Plan Note (Signed)
Today's discomfort does not sound cardiac in nature (not exertional, not relieved by rest) however he does endorse h/o mild exertional chest discomfort in the past. Will check baseline EKG today, will notify us if recurrent exertional chest tightness to consider cardiology referral. Pt agrees with plan.

## 2016-11-15 NOTE — Progress Notes (Signed)
Pre visit review using our clinic review tool, if applicable. No additional management support is needed unless otherwise documented below in the visit note. 

## 2016-11-15 NOTE — Patient Instructions (Addendum)
Goal blood pressure <140/90. Work on low salt/sodium diet - goal <2gm (2,000mg ) per day. Eat a diet high in fruits/vegetables and whole grains.  Look into mediterranean and DASH diet.  Goal activity is 176min/wk of moderate intensity exercise.  This can be split into 30 minute chunks.  If you are not at this level, you can start with smaller 10-15 min increments and slowly build up activity. Look at [place].org for more resources  EKG today.   DASH Eating Plan DASH stands for "Dietary Approaches to Stop Hypertension." The DASH eating plan is a healthy eating plan that has been shown to reduce high blood pressure (hypertension). It may also reduce your risk for type 2 diabetes, heart disease, and stroke. The DASH eating plan may also help with weight loss. What are tips for following this plan? General guidelines   Avoid eating more than 2,300 mg (milligrams) of salt (sodium) a day. If you have hypertension, you may need to reduce your sodium intake to 1,500 mg a day.  Limit alcohol intake to no more than 1 drink a day for nonpregnant women and 2 drinks a day for men. One drink equals 12 oz of beer, 5 oz of wine, or 1 oz of hard liquor.  Work with your health care provider to maintain a healthy body weight or to lose weight. Ask what an ideal weight is for you.  Get at least 30 minutes of exercise that causes your heart to beat faster (aerobic exercise) most days of the week. Activities may include walking, swimming, or biking.  Work with your health care provider or diet and nutrition specialist (dietitian) to adjust your eating plan to your individual calorie needs. Reading food labels   Check food labels for the amount of sodium per serving. Choose foods with less than 5 percent of the Daily Value of sodium. Generally, foods with less than 300 mg of sodium per serving fit into this eating plan.  To find whole grains, look for the word "whole" as the first word in the ingredient  list. Shopping   Buy products labeled as "low-sodium" or "no salt added."  Buy fresh foods. Avoid canned foods and premade or frozen meals. Cooking   Avoid adding salt when cooking. Use salt-free seasonings or herbs instead of table salt or sea salt. Check with your health care provider or pharmacist before using salt substitutes.  Do not fry foods. Cook foods using healthy methods such as baking, boiling, grilling, and broiling instead.  Cook with heart-healthy oils, such as olive, canola, soybean, or sunflower oil. Meal planning    Eat a balanced diet that includes:  5 or more servings of fruits and vegetables each day. At each meal, try to fill half of your plate with fruits and vegetables.  Up to 6-8 servings of whole grains each day.  Less than 6 oz of lean meat, poultry, or fish each day. A 3-oz serving of meat is about the same size as a deck of cards. One egg equals 1 oz.  2 servings of low-fat dairy each day.  A serving of nuts, seeds, or beans 5 times each week.  Heart-healthy fats. Healthy fats called Omega-3 fatty acids are found in foods such as flaxseeds and coldwater fish, like sardines, salmon, and mackerel.  Limit how much you eat of the following:  Canned or prepackaged foods.  Food that is high in trans fat, such as fried foods.  Food that is high in saturated fat, such as  fatty meat.  Sweets, desserts, sugary drinks, and other foods with added sugar.  Full-fat dairy products.  Do not salt foods before eating.  Try to eat at least 2 vegetarian meals each week.  Eat more home-cooked food and less restaurant, buffet, and fast food.  When eating at a restaurant, ask that your food be prepared with less salt or no salt, if possible. What foods are recommended? The items listed may not be a complete list. Talk with your dietitian about what dietary choices are best for you. Grains  Whole-grain or whole-wheat bread. Whole-grain or whole-wheat pasta.  Brown rice. [place]. Bulgur. Whole-grain and low-sodium cereals. Pita bread. Low-fat, low-sodium crackers. Whole-wheat flour tortillas. Vegetables  Fresh or frozen vegetables (raw, steamed, roasted, or grilled). Low-sodium or reduced-sodium tomato and vegetable juice. Low-sodium or reduced-sodium tomato sauce and tomato paste. Low-sodium or reduced-sodium canned vegetables. Fruits  All fresh, dried, or frozen fruit. Canned fruit in natural juice (without added sugar). Meat and other protein foods  Skinless chicken or [place]. Ground chicken or [place]. Pork with fat trimmed off. Fish and seafood. Egg whites. Dried beans, peas, or lentils. Unsalted nuts, nut butters, and seeds. Unsalted canned beans. Lean cuts of beef with fat trimmed off. Low-sodium, lean deli meat. Dairy  Low-fat (1%) or fat-free (skim) milk. Fat-free, low-fat, or reduced-fat cheeses. Nonfat, low-sodium ricotta or cottage cheese. Low-fat or nonfat yogurt. Low-fat, low-sodium cheese. Fats and oils  Soft margarine without trans fats. Vegetable oil. Low-fat, reduced-fat, or light mayonnaise and salad dressings (reduced-sodium). Canola, safflower, olive, soybean, and sunflower oils. Avocado. Seasoning and other foods  Herbs. Spices. Seasoning mixes without salt. Unsalted popcorn and pretzels. Fat-free sweets. What foods are not recommended? The items listed may not be a complete list. Talk with your dietitian about what dietary choices are best for you. Grains  Baked goods made with fat, such as croissants, muffins, or some breads. Dry pasta or rice meal packs. Vegetables  Creamed or fried vegetables. Vegetables in a cheese sauce. Regular canned vegetables (not low-sodium or reduced-sodium). Regular canned tomato sauce and paste (not low-sodium or reduced-sodium). Regular tomato and vegetable juice (not low-sodium or reduced-sodium). Angie Fava. Olives. Fruits  Canned fruit in a light or heavy syrup. Fried fruit. Fruit in cream  or butter sauce. Meat and other protein foods  Fatty cuts of meat. Ribs. Fried meat. Berniece Salines. Sausage. Bologna and other processed lunch meats. Salami. Fatback. Hotdogs. Bratwurst. Salted nuts and seeds. Canned beans with added salt. Canned or smoked fish. Whole eggs or egg yolks. Chicken or [place] with skin. Dairy  Whole or 2% milk, cream, and half-and-half. Whole or full-fat cream cheese. Whole-fat or sweetened yogurt. Full-fat cheese. Nondairy creamers. Whipped toppings. Processed cheese and cheese spreads. Fats and oils  Butter. Stick margarine. Lard. Shortening. Ghee. Bacon fat. Tropical oils, such as coconut, palm kernel, or palm oil. Seasoning and other foods  Salted popcorn and pretzels. Onion salt, garlic salt, seasoned salt, table salt, and sea salt. Worcestershire sauce. Tartar sauce. Barbecue sauce. Teriyaki sauce. Soy sauce, including reduced-sodium. Steak sauce. Canned and packaged gravies. Fish sauce. Oyster sauce. Cocktail sauce. Horseradish that you find on the shelf. Ketchup. Mustard. Meat flavorings and tenderizers. Bouillon cubes. Hot sauce and Tabasco sauce. Premade or packaged marinades. Premade or packaged taco seasonings. Relishes. Regular salad dressings. Where to find more information:  National Heart, Lung, and [place]: https://wilson-e[place].com/  American Heart Association: www.heart.org Summary  The DASH eating plan is a healthy eating plan that has been  shown to reduce high blood pressure (hypertension). It may also reduce your risk for type 2 diabetes, heart disease, and stroke.  With the DASH eating plan, you should limit salt (sodium) intake to 2,300 mg a day. If you have hypertension, you may need to reduce your sodium intake to 1,500 mg a day.  When on the DASH eating plan, aim to eat more fresh fruits and vegetables, whole grains, lean proteins, low-fat dairy, and heart-healthy fats.  Work with your health care provider or diet and nutrition specialist  (dietitian) to adjust your eating plan to your individual calorie needs. This information is not intended to replace advice given to you by your health care provider. Make sure you discuss any questions you have with your health care provider. Document Released: 06/23/2011 Document Revised: 06/27/2016 Document Reviewed: 06/27/2016 Elsevier Interactive Patient Education  2017 [employer].

## 2016-11-15 NOTE — Addendum Note (Signed)
Addended by: Emelia Salisbury C on: 11/15/2016 11:19 AM   Modules accepted: Orders

## 2016-11-15 NOTE — Progress Notes (Addendum)
BP 128/84 (BP Location: Right Arm, Cuff Size: Large)   Pulse 81   Temp 98.1 F (36.7 C) (Oral)   Wt 179 lb 8 oz (81.4 kg)   BMI 28.97 kg/m    CC: BP check Subjective:    Patient ID: Kurt Holmes, male    DOB: Apr 21, 1971, 46 y.o.   MRN: [id number]  HPI: Kurt Holmes is a 46 y.o. male presenting on 11/15/2016 for BP issues (blood pressure 139/89, not feeling right)   This morning noted epigastric pain described as intermittent pressure tightness, lightheaded, seeing floaters. Mild headache currently. He did skip breakfast - hit or miss. Some photopsia.  Checked bp this morning at [facility] on his way to work - 138/89, somewhat elevated for him. May not be sleeping enough.   No fevers/chills, vertigo, presyncope, palpitations, cough or dyspnea, leg swelling.   No h/o HTN. fmhx HTN.  Feels BP elevated after meals.  He has been cooking with new seasoning - may have had higher sodium content than normal.   H/o GERD a few times a week - treated with tums  Non smoker No alcohol No rec drugs.   Known OSA on CPAP. Endorses some intermittent episodes of exertional chest pressure.   Relevant past medical, surgical, family and social history reviewed and updated as indicated. Interim medical history since our last visit reviewed. Allergies and medications reviewed and updated. Outpatient Medications Prior to Visit  Medication Sig Dispense Refill  . acetaminophen (TYLENOL) 500 MG tablet Take 500 mg by mouth as needed.    . calcium carbonate (TUMS - DOSED IN MG ELEMENTAL CALCIUM) 500 MG chewable tablet Chew 1 tablet by mouth as needed for indigestion or heartburn.     No facility-administered medications prior to visit.      Per HPI unless specifically indicated in ROS section below Review of Systems     Objective:    BP 128/84 (BP Location: Right Arm, Cuff Size: Large)   Pulse 81   Temp 98.1 F (36.7 C) (Oral)   Wt 179 lb 8 oz (81.4 kg)   BMI 28.97 kg/m   Wt Readings from Last  3 Encounters:  11/15/16 179 lb 8 oz (81.4 kg)  12/07/15 176 lb (79.8 kg)  07/14/15 169 lb 9.6 oz (76.9 kg)    Physical Exam  Constitutional: He appears well-developed and well-nourished. No distress.  HENT:  Mouth/Throat: Oropharynx is clear and moist. No oropharyngeal exudate.  Eyes: Conjunctivae are normal. Pupils are equal, round, and reactive to light.  Neck: No thyromegaly present.  Cardiovascular: Normal rate, regular rhythm, normal heart sounds and intact distal pulses.   No murmur heard. Pulmonary/Chest: Effort normal and breath sounds normal. No respiratory distress. He has no wheezes. He has no rales. He exhibits no tenderness.  Abdominal: Soft. Bowel sounds are normal. He exhibits no distension and no mass. There is no tenderness. There is no rebound and no guarding.  Musculoskeletal: He exhibits no edema.  Nursing note and vitals reviewed.  Results for orders placed or performed during the hospital encounter of 09/05/12  CBC with Differential  Result Value Ref Range   WBC 8.3 4.0 - 10.5 K/uL   RBC 4.86 4.22 - 5.81 MIL/uL   Hemoglobin 15.2 13.0 - 17.0 g/dL   HCT 44.2 39.0 - 52.0 %   MCV 90.9 78.0 - 100.0 fL   MCH 31.3 26.0 - 34.0 pg   MCHC 34.4 30.0 - 36.0 g/dL   RDW 12.3 11.5 - 15.5 %  Platelets 245 150 - 400 K/uL   Neutrophils Relative % 60 43 - 77 %   Neutro Abs 4.9 1.7 - 7.7 K/uL   Lymphocytes Relative 35 12 - 46 %   Lymphs Abs 2.9 0.7 - 4.0 K/uL   Monocytes Relative 5 3 - 12 %   Monocytes Absolute 0.4 0.1 - 1.0 K/uL   Eosinophils Relative 0 0 - 5 %   Eosinophils Absolute 0.0 0.0 - 0.7 K/uL   Basophils Relative 0 0 - 1 %   Basophils Absolute 0.0 0.0 - 0.1 K/uL  Basic metabolic panel  Result Value Ref Range   Sodium 137 135 - 145 mEq/L   Potassium 3.6 3.5 - 5.1 mEq/L   Chloride 102 96 - 112 mEq/L   CO2 23 19 - 32 mEq/L   Glucose, Bld 199 (H) 70 - 99 mg/dL   BUN 14 6 - 23 mg/dL   Creatinine, Ser 1.18 0.50 - 1.35 mg/dL   Calcium 9.8 8.4 - 10.5 mg/dL    GFR calc non Af Amer 76 (L) >90 mL/min   GFR calc Af Amer 88 (L) >90 mL/min  Troponin I  Result Value Ref Range   Troponin I <0.30 <0.30 ng/mL   EKG NSR rate 60, normal axis, intervals, no acute ST/T changes.    Assessment & Plan:   Problem List Items Addressed This Visit    Chest discomfort - Primary    Today's discomfort does not sound cardiac in nature (not exertional, not relieved by rest) however he does endorse h/o mild exertional chest discomfort in the past. Will check baseline EKG today, will notify us if recurrent exertional chest tightness to consider cardiology referral. Pt agrees with plan.       Floaters    Encouraged he schedule f/u with ophtho to r/o retinal tear/detachment      Prehypertension    Reviewed healthy diet and lifestyle changes to keep blood pressure under good control. See pt instructions.           Follow up plan: Return if symptoms worsen or fail to improve.  Ria Bush, MD

## 2016-11-15 NOTE — Assessment & Plan Note (Signed)
Encouraged he schedule f/u with ophtho to r/o retinal tear/detachment

## 2016-11-18 ENCOUNTER — Encounter: Payer: Self-pay | Admitting: *Deleted

## 2017-10-04 ENCOUNTER — Telehealth: Payer: Self-pay

## 2017-10-04 ENCOUNTER — Encounter: Payer: Self-pay | Admitting: Primary Care

## 2017-10-04 ENCOUNTER — Ambulatory Visit: Payer: BLUE CROSS/BLUE SHIELD | Admitting: Primary Care

## 2017-10-04 VITALS — BP 116/76 | HR 70 | Temp 98.1°F | Wt 172.2 lb

## 2017-10-04 DIAGNOSIS — R0789 Other chest pain: Secondary | ICD-10-CM | POA: Diagnosis not present

## 2017-10-04 NOTE — Telephone Encounter (Signed)
Patient evaluated. See visit notes.

## 2017-10-04 NOTE — Patient Instructions (Addendum)
Your chest soreness is from muscle pain related to you pulling your dogs apart. Your EKG is normal and no signs of heart damage.   You will be contacted regarding your stress test.  Please let us know if you have not been contacted within one week.   For the pain you can take Tylenol or Ibuprofen per manufacturer recommendations. Do not exceed 3000mg  of Tylenol or 2400mg  of Ibuprofen per day.   It has been a pleasure seeing you today. Denita Lung, RN, Adult-Geriatric Nurse Practitioner Student and Allie Bossier, AGNP   Muscle Pain, Adult Muscle pain (myalgia) may be mild or severe. In most cases, the pain lasts only a short time and it goes away without treatment. It is normal to feel some muscle pain after starting a workout program. Muscles that have not been used often will be sore at first. Muscle pain may also be caused by many other things, including:  Overuse or muscle strain, especially if you are not in shape. This is the most common cause of muscle pain.  Injury.  Bruises.  Viruses, such as the flu.  Infectious diseases.  A chronic condition that causes muscle tenderness, fatigue, and headache (fibromyalgia).  A condition, such as lupus, in which the body's disease-fighting system attacks other organs in the body (autoimmune or rheumatologic diseases).  Certain drugs, including ACE inhibitors and statins.  To diagnose the cause of your muscle pain, your health care provider will do a physical exam and ask questions about the pain and when it began. If you have not had muscle pain for very long, your health care provider may want to wait before doing much testing. If your muscle pain has lasted a long time, your health care provider may want to run tests right away. In some cases, this may include tests to rule out certain conditions or illnesses. Treatment for muscle pain depends on the cause. Home care is often enough to relieve muscle pain. Your health care provider may  also prescribe anti-inflammatory medicine. Follow these instructions at home: Activity  If overuse is causing your muscle pain: ? Slow down your activities until the pain goes away. ? Do regular, gentle exercises if you are not usually active. ? Warm up before exercising. Stretch before and after exercising. This can help lower the risk of muscle pain.  Do not continue working out if the pain is very bad. Bad pain could mean that you have injured a muscle. Managing pain and discomfort   If directed, apply ice to the sore muscle: ? Put ice in a plastic bag. ? Place a towel between your skin and the bag. ? Leave the ice on for 20 minutes, 2-3 times a day.  You may also alternate between applying ice and applying heat as told by your health care provider. To apply heat, use the heat source that your health care provider recommends, such as a moist heat pack or a heating pad. ? Place a towel between your skin and the heat source. ? Leave the heat on for 20-30 minutes. ? Remove the heat if your skin turns bright red. This is especially important if you are unable to feel pain, heat, or cold. You may have a greater risk of getting burned. Medicines  Take over-the-counter and prescription medicines only as told by your health care provider.  Do not drive or use heavy machinery while taking prescription pain medicine. Contact a health care provider if:  Your muscle pain gets worse  and medicines do not help.  You have muscle pain that lasts longer than 3 days.  You have a rash or fever along with muscle pain.  You have muscle pain after a tick bite.  You have muscle pain while working out, even though you are in good physical condition.  You have redness, soreness, or swelling along with muscle pain.  You have muscle pain after starting a new medicine or changing the dose of a medicine. Get help right away if:  You have trouble breathing.  You have trouble swallowing.  You have  muscle pain along with a stiff neck, fever, and vomiting.  You have severe muscle weakness or cannot move part of your body. This information is not intended to replace advice given to you by your health care provider. Make sure you discuss any questions you have with your health care provider. Document Released: 05/26/2006 Document Revised: 01/22/2016 Document Reviewed: 11/24/2015 Elsevier Interactive Patient Education  2018 [employer].

## 2017-10-04 NOTE — Assessment & Plan Note (Addendum)
Suspect symptoms were a reaction to anxiety/increased adrenaline during the 5 minute incident of pulling two large dogs from fighting.  ECG today with NSR, rate of 73, no st-elevation or depression, no t-wave inversion.  Given history of chest pressure with near syncope in the past, also with recent symptoms, will send for exercise stress test to rule out any evidence of heart disease.   Tylenol or Ibuprofen PRN pain. Strict hospital precautions provided.

## 2017-10-04 NOTE — Progress Notes (Signed)
Subjective:    Patient ID: Kurt Holmes, male    DOB: [birth year], 47 y.o.   MRN: [id number]  HPI  Kurt Holmes is a 47 year old male with a history of near syncope, pre-hypertension, chest pressure who presents today with a chief complaint of chest pressure.   He separated his two large dogs (100 lbs each) last night who were fighting. This took 5 minutes to complete. Afterwards he felt "shaky", experienced chest soreness from shoulder to shoulder, drank some water and then vomited. Symptoms lasted for about 30 minutes, was able to eat dinner last night. His pain is located to the anterior chest wall from shoulder to shoulder. He has chest soreness today to the same location. He denies fevers, vomiting, exertional chest pain, shortness of breath, radiation of pain. He has never had an exercise stress test in the past.   Review of Systems  Constitutional: Negative for fever.  Respiratory: Negative for cough and shortness of breath.   Cardiovascular:       Chest pressure  Gastrointestinal:       One episode of vomiting last night  Musculoskeletal:       Chest and shoulder soreness  Neurological: Negative for headaches.       One episode of lightheadedness last night       Past Medical History:  Diagnosis Date  . History of chicken pox      Social History   Socioeconomic History  . Marital status: Married    Spouse name: Not on file  . Number of children: Not on file  . Years of education: Not on file  . Highest education level: Not on file  Social Needs  . Financial resource strain: Not on file  . Food insecurity - worry: Not on file  . Food insecurity - inability: Not on file  . Transportation needs - medical: Not on file  . Transportation needs - non-medical: Not on file  Occupational History  . Not on file  Tobacco Use  . Smoking status: Former Research scientist (life sciences)  . Smokeless tobacco: Former Systems developer  . Tobacco comment: in the teen years and twenties  Substance and Sexual Activity    . Alcohol use: No    Alcohol/week: 0.0 oz  . Drug use: No  . Sexual activity: Not on file  Other Topics Concern  . 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 KNEE SURGERY    . SHOULDER SURGERY    . WISDOM TOOTH EXTRACTION      Family History  Problem Relation Age of Onset  . Hypertension Mother   . Dementia Father   . Hypertension Father   . CAD Father        blockages  . Hypertension Sister   . CAD Maternal Grandfather        bypass surgery  . Colon cancer Neg Hx   . Prostate cancer Neg Hx     No Known Allergies  Current Outpatient Medications on File Prior to Visit  Medication Sig Dispense Refill  . acetaminophen (TYLENOL) 500 MG tablet Take 500 mg by mouth as needed.    . calcium carbonate (TUMS - DOSED IN MG ELEMENTAL CALCIUM) 500 MG chewable tablet Chew 1 tablet by mouth as needed for indigestion or heartburn.     No current facility-administered  medications on file prior to visit.     BP 116/76 (BP Location: Right Arm, Patient Position: Sitting, Cuff Size: Normal)   Pulse 70   Temp 98.1 F (36.7 C) (Oral)   Wt 172 lb 4 oz (78.1 kg)   SpO2 98%   BMI 27.80 kg/m    Objective:   Physical Exam  Constitutional: He appears well-nourished.  Neck: Neck supple.  Cardiovascular: Normal rate, regular rhythm and normal heart sounds.  No murmur heard. Pulmonary/Chest: Effort normal and breath sounds normal.  Musculoskeletal:       Arms: Chest soreness to bilateral shoulders. Chest pain initially to bilateral shoulders and across anterior chest, also to substernal chest. No tenderness upon palpation.   Skin: Skin is warm and dry.          Assessment & Plan:

## 2017-10-04 NOTE — Telephone Encounter (Signed)
Pt walked in; last night was pulling two 100 lb dogs apart which took about 5 mins.afterwards pt felt weak, shaky like he was going to pass out. Pt did not pass out, pt rested but has pressure in neck, vomited x 1 and chest felt tight; pt did not go to ED due to fear of what would be found; this morning pt was going to work and stopped at [facility]; today pt is sore and tight across chest and upper body; not sure if due to holding dogs apart or  Not; slight dizziness but has sinus issue also; no CP, SOB, H/A or vision changes. Chest tightness does not change with exertion or resting.BP 124/80, P 78, pulse ox 99% room air and T 98.1 Dr Damita Dunnings out of office. Allie Bossier NP will see pt. FYI to Gentry Fitz NP

## 2017-10-04 NOTE — Progress Notes (Signed)
Subjective:    Patient ID: Kurt Holmes, male    DOB: [birth year], 47 y.o.   MRN: [id number]  HPI Kurt Holmes is a 47 y.o. male who presents today with some Chest soreness. Last night his two very large dogs got into a fight and it took him about 5 minutes to pull them apart. After the dog fight he started having chest pain across the front of his chest from shoulder to shoulder. He also reported feeling very shaky, and had one episode of vomiting after drinking some water. All his symptoms dissipated after 30 minutes. He was able to eat dinner last night with out complication and took an ASA before going to bed.    Review of Systems  Constitutional: Negative for chills, fatigue and fever.  Respiratory: Negative for shortness of breath.   Cardiovascular: Negative for palpitations.  Musculoskeletal: Positive for neck stiffness.       Past Medical History:  Diagnosis Date  . History of chicken pox    Past Surgical History:  Procedure Laterality Date  . KNEE SURGERY    . SHOULDER SURGERY    . WISDOM TOOTH EXTRACTION     Family History  Problem Relation Age of Onset  . Hypertension Mother   . Dementia Father   . Hypertension Father   . CAD Father        blockages  . Hypertension Sister   . CAD Maternal Grandfather        bypass surgery  . Colon cancer Neg Hx   . Prostate cancer Neg Hx    Social History   Socioeconomic History  . Marital status: Married    Spouse name: Not on file  . Number of children: Not on file  . Years of education: Not on file  . Highest education level: Not on file  Social Needs  . Financial resource strain: Not on file  . Food insecurity - worry: Not on file  . Food insecurity - inability: Not on file  . Transportation needs - medical: Not on file  . Transportation needs - non-medical: Not on file  Occupational History  . Not on file  Tobacco Use  . Smoking status: Former Research scientist (life sciences)  . Smokeless tobacco: Former Systems developer  . Tobacco comment: in the  teen years and twenties  Substance and Sexual Activity  . Alcohol use: No    Alcohol/week: 0.0 oz  . Drug use: No  . Sexual activity: Not on file  Other Topics Concern  . 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 acetaminophen (TYLENOL) 500 MG tablet Take 500 mg by mouth as needed.    . calcium carbonate (TUMS - DOSED IN MG ELEMENTAL CALCIUM) 500 MG chewable tablet Chew 1 tablet by mouth as needed for indigestion or heartburn.     No current facility-administered medications on file prior to visit.     Objective:   Physical Exam  Constitutional: He appears well-nourished. No distress.  Neck: Normal range of motion. Neck supple.  Cardiovascular: Normal rate, regular rhythm and normal heart sounds. Exam reveals no gallop and no friction rub.  No murmur heard. Pulmonary/Chest: Effort normal and breath sounds normal. No respiratory distress. He has no wheezes. He has no rales. He exhibits no tenderness.  Musculoskeletal: Normal range of  motion.       Right shoulder: He exhibits tenderness.       Left shoulder: He exhibits tenderness.  Skin: He is not diaphoretic.    BP 116/76 (BP Location: Right Arm, Patient Position: Sitting, Cuff Size: Normal)   Pulse 70   Temp 98.1 F (36.7 C) (Oral)   Wt 172 lb 4 oz (78.1 kg)   SpO2 98%   BMI 27.80 kg/m        Assessment & Plan:  1. Chest pressure - Musculoskeletal in nature probably from straining when pulling dogs apart  -Tylenol or Ibuprofen per manufacturer recommendations - EKG 12-Lead NSR and unchanged from prior on 12/02/16 - Cardiac Stress Test ordered since pt has had syncope in past   Denita Lung, RN, Adult-Geriatric Nurse Practitioner Student

## 2017-10-06 ENCOUNTER — Telehealth: Payer: Self-pay | Admitting: *Deleted

## 2017-10-06 NOTE — Telephone Encounter (Signed)
Called patient and he verbalized understanding of appointment date and time and the following pre-procedural instructions.   DO NOT drink or eat foods with caffeine for 24 hours before the test. (Chocolate, coffee, tea, decaf coffee/tea, or energy drinks)  DO NOT smoke for 4 hours before your test.  If you use an inhaler, bring it with you to the test.  Wear comfortable shoes and clothing. Women do not wear dresses.

## 2017-10-09 ENCOUNTER — Ambulatory Visit (INDEPENDENT_AMBULATORY_CARE_PROVIDER_SITE_OTHER): Payer: BLUE CROSS/BLUE SHIELD

## 2017-10-09 DIAGNOSIS — R0789 Other chest pain: Secondary | ICD-10-CM

## 2017-10-11 LAB — EXERCISE TOLERANCE TEST
Estimated workload: 12.8 METS
Exercise duration (min): 10 min
Exercise duration (sec): 40 s
MPHR: 174 {beats}/min
Peak HR: 162 {beats}/min
Percent HR: 93 %
RPE: 14
Rest HR: 77 {beats}/min

## 2017-10-13 DIAGNOSIS — H0015 Chalazion left lower eyelid: Secondary | ICD-10-CM | POA: Diagnosis not present

## 2017-10-13 DIAGNOSIS — H00022 Hordeolum internum right lower eyelid: Secondary | ICD-10-CM | POA: Diagnosis not present

## 2017-10-30 DIAGNOSIS — L218 Other seborrheic dermatitis: Secondary | ICD-10-CM | POA: Diagnosis not present

## 2017-10-30 DIAGNOSIS — D2261 Melanocytic nevi of right upper limb, including shoulder: Secondary | ICD-10-CM | POA: Diagnosis not present

## 2017-10-30 DIAGNOSIS — D2271 Melanocytic nevi of right lower limb, including hip: Secondary | ICD-10-CM | POA: Diagnosis not present

## 2017-10-30 DIAGNOSIS — X32XXXA Exposure to sunlight, initial encounter: Secondary | ICD-10-CM | POA: Diagnosis not present

## 2017-10-30 DIAGNOSIS — D485 Neoplasm of uncertain behavior of skin: Secondary | ICD-10-CM | POA: Diagnosis not present

## 2017-10-30 DIAGNOSIS — D2262 Melanocytic nevi of left upper limb, including shoulder: Secondary | ICD-10-CM | POA: Diagnosis not present

## 2017-10-30 DIAGNOSIS — L570 Actinic keratosis: Secondary | ICD-10-CM | POA: Diagnosis not present

## 2017-10-31 DIAGNOSIS — D034 Melanoma in situ of scalp and neck: Secondary | ICD-10-CM | POA: Diagnosis not present

## 2017-10-31 DIAGNOSIS — D224 Melanocytic nevi of scalp and neck: Secondary | ICD-10-CM | POA: Diagnosis not present

## 2017-11-07 DIAGNOSIS — L905 Scar conditions and fibrosis of skin: Secondary | ICD-10-CM | POA: Diagnosis not present

## 2017-11-07 DIAGNOSIS — D039 Melanoma in situ, unspecified: Secondary | ICD-10-CM

## 2017-11-07 DIAGNOSIS — D034 Melanoma in situ of scalp and neck: Secondary | ICD-10-CM | POA: Diagnosis not present

## 2017-11-07 HISTORY — DX: Melanoma in situ, unspecified: D03.9

## 2017-11-20 ENCOUNTER — Encounter: Payer: Self-pay | Admitting: Family Medicine

## 2017-11-20 DIAGNOSIS — D039 Melanoma in situ, unspecified: Secondary | ICD-10-CM | POA: Insufficient documentation

## 2017-11-22 ENCOUNTER — Encounter: Payer: Self-pay | Admitting: Internal Medicine

## 2017-11-22 ENCOUNTER — Telehealth: Payer: Self-pay | Admitting: Internal Medicine

## 2017-11-22 NOTE — Telephone Encounter (Signed)
3 attempts to schedule fu appt from recall list.   Deleting recall.  °Mailed Letter  °

## 2018-02-23 DIAGNOSIS — L820 Inflamed seborrheic keratosis: Secondary | ICD-10-CM | POA: Diagnosis not present

## 2018-02-23 DIAGNOSIS — Z85820 Personal history of malignant melanoma of skin: Secondary | ICD-10-CM | POA: Diagnosis not present

## 2018-02-23 DIAGNOSIS — Z08 Encounter for follow-up examination after completed treatment for malignant neoplasm: Secondary | ICD-10-CM | POA: Diagnosis not present

## 2018-02-23 DIAGNOSIS — D2361 Other benign neoplasm of skin of right upper limb, including shoulder: Secondary | ICD-10-CM | POA: Diagnosis not present

## 2018-02-23 DIAGNOSIS — L570 Actinic keratosis: Secondary | ICD-10-CM | POA: Diagnosis not present

## 2018-02-23 DIAGNOSIS — X32XXXA Exposure to sunlight, initial encounter: Secondary | ICD-10-CM | POA: Diagnosis not present

## 2018-03-07 ENCOUNTER — Ambulatory Visit: Payer: BLUE CROSS/BLUE SHIELD | Admitting: Internal Medicine

## 2018-03-07 ENCOUNTER — Encounter: Payer: Self-pay | Admitting: Internal Medicine

## 2018-03-07 VITALS — BP 124/74 | HR 98 | Temp 98.0°F | Ht 66.0 in | Wt 179.5 lb

## 2018-03-07 DIAGNOSIS — J0100 Acute maxillary sinusitis, unspecified: Secondary | ICD-10-CM | POA: Diagnosis not present

## 2018-03-07 MED ORDER — AMOXICILLIN 500 MG PO TABS: 1000.0000 mg | ORAL_TABLET | Freq: Two times a day (BID) | ORAL | 0 refills | Status: AC

## 2018-03-07 NOTE — Assessment & Plan Note (Signed)
May still be viral Discussed supportive care---analgesics, cough med, start flonase If worsens, fill the amoxil

## 2018-03-07 NOTE — Progress Notes (Signed)
Subjective:    Patient ID: Kurt Holmes, male    DOB: 1970/12/06, 47 y.o.   MRN: [id number]  HPI Here due to respiratory illness  Scratchy throat Bad nasal/head congestion Dark green nasal discharge ---especially in am. 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 acetaminophen (TYLENOL) 500 MG tablet Take 500 mg by mouth as needed.    . calcium carbonate (TUMS - DOSED IN MG ELEMENTAL CALCIUM) 500 MG chewable tablet Chew 1 tablet by mouth as needed for indigestion or heartburn.     No current facility-administered medications on file prior to visit.     No Known Allergies  Past Medical History:  Diagnosis Date  . History of chicken pox   . Melanoma in situ ([place]) 11/07/2017   R neck    Past Surgical History:  Procedure Laterality Date  . KNEE SURGERY    . SHOULDER SURGERY    . WISDOM TOOTH EXTRACTION      Family History  Problem Relation Age of Onset  . Hypertension Mother   . Dementia Father   . Hypertension Father   . CAD Father        blockages  . Hypertension Sister   . CAD Maternal Grandfather        bypass surgery  . Colon cancer Neg Hx   . Prostate cancer Neg Hx     Social History   Socioeconomic History  . Marital status: Married    Spouse name: Not on file  . Number of children: Not on file  . Years of education: Not on file  . Highest education level: Not on file  Occupational History  . Not on file  Social Needs  . Financial resource strain: Not on file  . Food insecurity:    Worry: Not on file    Inability: Not on file  . Transportation needs:    Medical: Not on file    Non-medical: Not on file  Tobacco Use  . Smoking status:  Former Research scientist (life sciences)  . Smokeless tobacco: Former Systems developer  . Tobacco comment: in the teen years and twenties  Substance and Sexual Activity  . Alcohol use: No    Alcohol/week: 0.0 standard drinks  . Drug use: No  . Sexual activity: Not on file  Lifestyle  . Physical activity:    Days per week: Not on file    Minutes per session: Not on file  . Stress: Not on file  Relationships  . Social connections:    Talks on phone: Not on file    Gets together: Not on file    Attends religious service: Not on file    Active member of club or organization: Not on file    Attends meetings of clubs or organizations: Not on file    Relationship status: Not on file  . Intimate partner violence:    Fear of current or ex partner: Not on file    Emotionally abused: Not on file    Physically abused: Not on file    Forced sexual activity: Not on file  Other Topics Concern  . 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  Looks mildly ill  HENT:  Mild maxillary tenderness Marked nasal swelling TMs normal Slight pharyngeal injection  Neck: No thyromegaly present.  Respiratory: Effort normal and breath sounds normal. No respiratory distress. He has no wheezes. He has no rales.  Lymphadenopathy:    He has no cervical adenopathy.           Assessment & Plan:

## 2018-03-07 NOTE — Patient Instructions (Signed)
Start the antibiotic if you are worsening, instead of improving, over the next few days

## 2018-08-28 DIAGNOSIS — D2261 Melanocytic nevi of right upper limb, including shoulder: Secondary | ICD-10-CM | POA: Diagnosis not present

## 2018-08-28 DIAGNOSIS — D2262 Melanocytic nevi of left upper limb, including shoulder: Secondary | ICD-10-CM | POA: Diagnosis not present

## 2018-08-28 DIAGNOSIS — X32XXXA Exposure to sunlight, initial encounter: Secondary | ICD-10-CM | POA: Diagnosis not present

## 2018-08-28 DIAGNOSIS — L570 Actinic keratosis: Secondary | ICD-10-CM | POA: Diagnosis not present

## 2018-08-28 DIAGNOSIS — D2272 Melanocytic nevi of left lower limb, including hip: Secondary | ICD-10-CM | POA: Diagnosis not present

## 2018-08-28 DIAGNOSIS — Z85820 Personal history of malignant melanoma of skin: Secondary | ICD-10-CM | POA: Diagnosis not present

## 2018-12-24 DIAGNOSIS — Z1159 Encounter for screening for other viral diseases: Secondary | ICD-10-CM | POA: Diagnosis not present

## 2019-02-26 DIAGNOSIS — L570 Actinic keratosis: Secondary | ICD-10-CM | POA: Diagnosis not present

## 2019-02-26 DIAGNOSIS — C4441 Basal cell carcinoma of skin of scalp and neck: Secondary | ICD-10-CM | POA: Diagnosis not present

## 2019-02-26 DIAGNOSIS — D485 Neoplasm of uncertain behavior of skin: Secondary | ICD-10-CM | POA: Diagnosis not present

## 2019-02-26 DIAGNOSIS — Z85820 Personal history of malignant melanoma of skin: Secondary | ICD-10-CM | POA: Diagnosis not present

## 2019-02-26 DIAGNOSIS — D2272 Melanocytic nevi of left lower limb, including hip: Secondary | ICD-10-CM | POA: Diagnosis not present

## 2019-02-26 DIAGNOSIS — D2262 Melanocytic nevi of left upper limb, including shoulder: Secondary | ICD-10-CM | POA: Diagnosis not present

## 2019-02-26 DIAGNOSIS — D2261 Melanocytic nevi of right upper limb, including shoulder: Secondary | ICD-10-CM | POA: Diagnosis not present

## 2019-04-08 DIAGNOSIS — C4441 Basal cell carcinoma of skin of scalp and neck: Secondary | ICD-10-CM | POA: Diagnosis not present

## 2019-08-29 DIAGNOSIS — L570 Actinic keratosis: Secondary | ICD-10-CM | POA: Diagnosis not present

## 2019-08-29 DIAGNOSIS — Z85820 Personal history of malignant melanoma of skin: Secondary | ICD-10-CM | POA: Diagnosis not present

## 2019-08-29 DIAGNOSIS — D2262 Melanocytic nevi of left upper limb, including shoulder: Secondary | ICD-10-CM | POA: Diagnosis not present

## 2019-08-29 DIAGNOSIS — D2261 Melanocytic nevi of right upper limb, including shoulder: Secondary | ICD-10-CM | POA: Diagnosis not present

## 2019-08-29 DIAGNOSIS — Z85828 Personal history of other malignant neoplasm of skin: Secondary | ICD-10-CM | POA: Diagnosis not present

## 2019-08-29 DIAGNOSIS — X32XXXA Exposure to sunlight, initial encounter: Secondary | ICD-10-CM | POA: Diagnosis not present

## 2019-10-23 ENCOUNTER — Other Ambulatory Visit: Payer: Self-pay | Admitting: Family Medicine

## 2019-10-23 DIAGNOSIS — Z131 Encounter for screening for diabetes mellitus: Secondary | ICD-10-CM

## 2019-10-23 DIAGNOSIS — Z13220 Encounter for screening for lipoid disorders: Secondary | ICD-10-CM

## 2019-10-25 ENCOUNTER — Other Ambulatory Visit: Payer: Self-pay

## 2019-10-25 ENCOUNTER — Other Ambulatory Visit (INDEPENDENT_AMBULATORY_CARE_PROVIDER_SITE_OTHER): Payer: BC Managed Care – PPO

## 2019-10-25 DIAGNOSIS — Z13220 Encounter for screening for lipoid disorders: Secondary | ICD-10-CM

## 2019-10-25 DIAGNOSIS — Z131 Encounter for screening for diabetes mellitus: Secondary | ICD-10-CM

## 2019-10-25 LAB — LIPID PANEL
Cholesterol: 201 mg/dL — ABNORMAL HIGH (ref 0–200)
HDL: 30.3 mg/dL — ABNORMAL LOW (ref >39.00)
NonHDL: 170.39
Total CHOL/HDL Ratio: 7
Triglycerides: 394 mg/dL — ABNORMAL HIGH (ref 0.0–149.0)
VLDL: 78.8 mg/dL — ABNORMAL HIGH (ref 0.0–40.0)

## 2019-10-25 LAB — GLUCOSE, RANDOM: Glucose, Bld: 112 mg/dL — ABNORMAL HIGH (ref 70–99)

## 2019-10-25 LAB — LDL CHOLESTEROL, DIRECT: Direct LDL: 95 mg/dL

## 2019-10-28 ENCOUNTER — Encounter: Payer: Self-pay | Admitting: Family Medicine

## 2019-10-28 ENCOUNTER — Ambulatory Visit (INDEPENDENT_AMBULATORY_CARE_PROVIDER_SITE_OTHER): Payer: BC Managed Care – PPO | Admitting: Family Medicine

## 2019-10-28 ENCOUNTER — Other Ambulatory Visit: Payer: Self-pay

## 2019-10-28 VITALS — BP 106/80 | HR 62 | Temp 97.2°F | Ht 66.0 in | Wt 176.2 lb

## 2019-10-28 DIAGNOSIS — Z0000 Encounter for general adult medical examination without abnormal findings: Secondary | ICD-10-CM

## 2019-10-28 DIAGNOSIS — E785 Hyperlipidemia, unspecified: Secondary | ICD-10-CM

## 2019-10-28 DIAGNOSIS — R739 Hyperglycemia, unspecified: Secondary | ICD-10-CM

## 2019-10-28 DIAGNOSIS — D039 Melanoma in situ, unspecified: Secondary | ICD-10-CM

## 2019-10-28 DIAGNOSIS — R0683 Snoring: Secondary | ICD-10-CM

## 2019-10-28 DIAGNOSIS — Z7189 Other specified counseling: Secondary | ICD-10-CM

## 2019-10-28 DIAGNOSIS — M79643 Pain in unspecified hand: Secondary | ICD-10-CM

## 2019-10-28 MED ORDER — DICLOFENAC SODIUM 1 % EX GEL: 2.0000 g | Freq: Four times a day (QID) | CUTANEOUS | 2 refills | Status: DC | PRN

## 2019-10-28 NOTE — Progress Notes (Signed)
This visit occurred during the SARS-CoV-2 public health emergency.  Safety protocols were in place, including screening questions prior to the visit, additional usage of staff PPE, and extensive cleaning of exam room while observing appropriate contact time as indicated for disinfecting solutions.  CPE- See plan.  Routine anticipatory guidance given to patient.  See health maintenance.  The possibility exists that previously documented standard health maintenance information may have been brought forward from a previous encounter into this note.  If needed, that same information has been updated to reflect the current situation based on today's encounter.    Tetanus 2016 Flu d/w pt.  Encouraged.   PNA not due Shingles not due covid vaccine d/w pt.   PSA and colon cancer screening not due.   Living will d/w pt.  Wife designated if patient were incapacitated.   Diet and exercise d/w pt.    Sleep apnea d/w pt.  Has been off CPAP over the last year.  He is going to get a cleaning device for his equipment and then restart.    He is working more on diet in the meantime.    He has routine dermatology f/u re: h/o melanoma.    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  Discussed options for likely arthritis.  Low carbohydrate handout discussed with patient.  PMH and SH reviewed  Meds, vitals, and allergies reviewed.   ROS: Per HPI.  Unless specifically indicated otherwise in HPI, the patient denies:  General: fever. Eyes: acute vision changes ENT: sore throat Cardiovascular: chest pain Respiratory: SOB GI: vomiting GU: dysuria Musculoskeletal: acute back pain Derm: acute rash Neuro: acute motor dysfunction Psych: worsening mood Endocrine: polydipsia Heme: bleeding Allergy: hayfever  GEN: nad, alert and oriented HEENT: ncat NECK: supple w/o LA CV: rrr. PULM: ctab, no inc wob ABD: soft, +bs EXT: no edema SKIN: no acute  rash  The 10-year ASCVD risk score Mikey Bussing DC Jr., et al., 2013) is: 3.6%   Values used to calculate the score:     Age: 49 years     Sex: Male     Is Non-Hispanic African American: No     Diabetic: No     Tobacco smoker: No     Systolic Blood Pressure: [id number] mmHg     Is BP treated: No     HDL Cholesterol: 30.3 mg/dL     Total Cholesterol: 201 mg/dL

## 2019-10-28 NOTE — Patient Instructions (Signed)
Use the eat right diet and recheck fasting labs in about 3 months.  Use diclofenac gel as needed on your hands.  Update me as needed.  Take care.  Glad to see you.

## 2019-10-30 DIAGNOSIS — Z7189 Other specified counseling: Secondary | ICD-10-CM | POA: Insufficient documentation

## 2019-10-30 DIAGNOSIS — M79643 Pain in unspecified hand: Secondary | ICD-10-CM | POA: Insufficient documentation

## 2019-10-30 DIAGNOSIS — Z0000 Encounter for general adult medical examination without abnormal findings: Secondary | ICD-10-CM | POA: Insufficient documentation

## 2019-10-30 NOTE — Assessment & Plan Note (Signed)
Living will d/w pt.  Wife designated if patient were incapacitated.   ?

## 2019-10-30 NOTE — Assessment & Plan Note (Signed)
No current issues.  He has routine follow-up with dermatology.

## 2019-10-30 NOTE — Assessment & Plan Note (Signed)
He has pain on IP joint range of motion and he can try diclofenac gel in the meantime and update me as needed.  He agrees.

## 2019-10-30 NOTE — Assessment & Plan Note (Signed)
Low carbohydrate handout discussed with patient.  He will work on that along with exercise and recheck labs in a few months.  See after visit summary.  He agrees.

## 2019-10-30 NOTE — Assessment & Plan Note (Signed)
Discussed restarting CPAP use.  See above.

## 2019-10-30 NOTE — Assessment & Plan Note (Signed)
Tetanus 2016 Flu d/w pt.  Encouraged.   PNA not due Shingles not due covid vaccine d/w pt.   PSA and colon cancer screening not due.   Living will d/w pt.  Wife designated if patient were incapacitated.   Diet and exercise d/w pt.

## 2020-01-27 ENCOUNTER — Other Ambulatory Visit: Payer: BC Managed Care – PPO

## 2020-02-24 ENCOUNTER — Other Ambulatory Visit: Payer: Self-pay

## 2020-02-24 ENCOUNTER — Other Ambulatory Visit (INDEPENDENT_AMBULATORY_CARE_PROVIDER_SITE_OTHER): Payer: No Typology Code available for payment source

## 2020-02-24 DIAGNOSIS — R739 Hyperglycemia, unspecified: Secondary | ICD-10-CM

## 2020-02-24 DIAGNOSIS — E785 Hyperlipidemia, unspecified: Secondary | ICD-10-CM

## 2020-02-24 LAB — COMPREHENSIVE METABOLIC PANEL
ALT: 29 U/L (ref 0–53)
AST: 17 U/L (ref 0–37)
Albumin: 4.5 g/dL (ref 3.5–5.2)
Alkaline Phosphatase: 63 U/L (ref 39–117)
BUN: 13 mg/dL (ref 6–23)
CO2: 26 mEq/L (ref 19–32)
Calcium: 9.8 mg/dL (ref 8.4–10.5)
Chloride: 102 mEq/L (ref 96–112)
Creatinine, Ser: 1.19 mg/dL (ref 0.40–1.50)
GFR: 65.12 mL/min (ref >60.00)
Glucose, Bld: 112 mg/dL — ABNORMAL HIGH (ref 70–99)
Potassium: 4.5 mEq/L (ref 3.5–5.1)
Sodium: 137 mEq/L (ref 135–145)
Total Bilirubin: 0.5 mg/dL (ref 0.2–1.2)
Total Protein: 7.6 g/dL (ref 6.0–8.3)

## 2020-02-24 LAB — LDL CHOLESTEROL, DIRECT: Direct LDL: 118 mg/dL

## 2020-02-24 LAB — LIPID PANEL
Cholesterol: 211 mg/dL — ABNORMAL HIGH (ref 0–200)
HDL: 29.4 mg/dL — ABNORMAL LOW (ref >39.00)
NonHDL: 181.29
Total CHOL/HDL Ratio: 7
Triglycerides: 376 mg/dL — ABNORMAL HIGH (ref 0.0–149.0)
VLDL: 75.2 mg/dL — ABNORMAL HIGH (ref 0.0–40.0)

## 2020-02-24 LAB — HEMOGLOBIN A1C: Hgb A1c MFr Bld: 5.9 % (ref 4.6–6.5)

## 2020-02-26 ENCOUNTER — Other Ambulatory Visit: Payer: Self-pay | Admitting: Family Medicine

## 2020-02-26 MED ORDER — FISH OIL 1000 MG PO CAPS: 1000.0000 mg | ORAL_CAPSULE | Freq: Every day | ORAL | 0 refills | Status: DC

## 2020-07-20 ENCOUNTER — Telehealth: Payer: Self-pay | Admitting: *Deleted

## 2020-07-20 NOTE — Telephone Encounter (Signed)
Will discuss at upcoming appt.

## 2020-07-20 NOTE — Telephone Encounter (Signed)
Patient called stating that he went to [facility] this morning and scheduled an appointment to go back at 3:00 to be seen. Patient stated when he went back the office was closed and a sign on the door. Patient stated that he did a home covid test today and the results were positive. Patient stated that he started with symptoms last night. Patient complained of dry cough,, chest pressure, sore throat, chills and body aches. Patient stated that he can't say that he has SOB or difficulty breathing but his breathing is not as good as it has been. Patient stated that he does have sleep apnea but has not been using his machine for a while because he does not feel that it is clean. Patient did not sound winded on the phone and spoke in full sentences. Patient stated that his whole house has covid and is in quarantine.  Patient scheduled for a virtual visit with Nicki Reaper NP tomorrow 07/21/20 at 2:00 pm. Patient was given ER precautions and he verbalized understanding.

## 2020-07-21 ENCOUNTER — Encounter: Payer: Self-pay | Admitting: Internal Medicine

## 2020-07-21 ENCOUNTER — Ambulatory Visit (INDEPENDENT_AMBULATORY_CARE_PROVIDER_SITE_OTHER): Payer: No Typology Code available for payment source | Admitting: Internal Medicine

## 2020-07-21 VITALS — HR 106 | Temp 99.4°F

## 2020-07-21 DIAGNOSIS — R0789 Other chest pain: Secondary | ICD-10-CM

## 2020-07-21 DIAGNOSIS — R0989 Other specified symptoms and signs involving the circulatory and respiratory systems: Secondary | ICD-10-CM

## 2020-07-21 DIAGNOSIS — J029 Acute pharyngitis, unspecified: Secondary | ICD-10-CM

## 2020-07-21 DIAGNOSIS — R059 Cough, unspecified: Secondary | ICD-10-CM

## 2020-07-21 DIAGNOSIS — U071 COVID-19: Secondary | ICD-10-CM | POA: Diagnosis not present

## 2020-07-21 DIAGNOSIS — R519 Headache, unspecified: Secondary | ICD-10-CM | POA: Diagnosis not present

## 2020-07-21 DIAGNOSIS — R0602 Shortness of breath: Secondary | ICD-10-CM

## 2020-07-21 MED ORDER — PREDNISONE 10 MG PO TABS: ORAL_TABLET | ORAL | 0 refills | Status: DC

## 2020-07-21 MED ORDER — HYDROCOD POLST-CPM POLST ER 10-8 MG/5ML PO SUER: 5.0000 mL | Freq: Every evening | ORAL | 0 refills | Status: DC | PRN

## 2020-07-21 MED ORDER — ALBUTEROL SULFATE HFA 108 (90 BASE) MCG/ACT IN AERS: 2.0000 | INHALATION_SPRAY | Freq: Four times a day (QID) | RESPIRATORY_TRACT | 0 refills | Status: DC | PRN

## 2020-07-21 NOTE — Patient Instructions (Signed)
COVID-19 COVID-19 is a respiratory infection that is caused by a virus called severe acute respiratory syndrome coronavirus 2 (SARS-CoV-2). The disease is also known as coronavirus disease or novel coronavirus. In some people, the virus may not cause any symptoms. In others, it may cause a serious infection. The infection can get worse quickly and can lead to complications, such as:  Pneumonia, or infection of the lungs.  Acute respiratory distress syndrome or ARDS. This is a condition in which fluid build-up in the lungs prevents the lungs from filling with air and passing oxygen into the blood.  Acute respiratory failure. This is a condition in which there is not enough oxygen passing from the lungs to the body or when carbon dioxide is not passing from the lungs out of the body.  Sepsis or septic shock. This is a serious bodily reaction to an infection.  Blood clotting problems.  Secondary infections due to bacteria or fungus.  Organ failure. This is when your body's organs stop working. The virus that causes COVID-19 is contagious. This means that it can spread from person to person through droplets from coughs and sneezes (respiratory secretions). What are the causes? This illness is caused by a virus. You may catch the virus by:  Breathing in droplets from an infected person. Droplets can be spread by a person breathing, speaking, singing, coughing, or sneezing.  Touching something, like a table or a doorknob, that was exposed to the virus (contaminated) and then touching your mouth, nose, or eyes. What increases the risk? Risk for infection You are more likely to be infected with this virus if you:  Are within 6 feet (2 meters) of a person with COVID-19.  Provide care for or live with a person who is infected with COVID-19.  Spend time in crowded indoor spaces or live in shared housing. Risk for serious illness You are more likely to become seriously ill from the virus if you:   Are 50 years of age or older. The higher your age, the more you are at risk for serious illness.  Live in a nursing home or long-term care facility.  Have cancer.  Have a long-term (chronic) disease such as: ? Chronic lung disease, including chronic obstructive pulmonary disease or asthma. ? A long-term disease that lowers your body's ability to fight infection (immunocompromised). ? Heart disease, including heart failure, a condition in which the arteries that lead to the heart become narrow or blocked (coronary artery disease), a disease which makes the heart muscle thick, weak, or stiff (cardiomyopathy). ? Diabetes. ? Chronic kidney disease. ? Sickle cell disease, a condition in which red blood cells have an abnormal "sickle" shape. ? Liver disease.  Are obese. What are the signs or symptoms? Symptoms of this condition can range from mild to severe. Symptoms may appear any time from 2 to 14 days after being exposed to the virus. They include:  A fever or chills.  A cough.  Difficulty breathing.  Headaches, body aches, or muscle aches.  Runny or stuffy (congested) nose.  A sore throat.  New loss of taste or smell. Some people may also have stomach problems, such as nausea, vomiting, or diarrhea. Other people may not have any symptoms of COVID-19. How is this diagnosed? This condition may be diagnosed based on:  Your signs and symptoms, especially if: ? You live in an area with a COVID-19 outbreak. ? You recently traveled to or from an area where the virus is common. ? You   provide care for or live with a person who was diagnosed with COVID-19. ? You were exposed to a person who was diagnosed with COVID-19.  A physical exam.  Lab tests, which may include: ? Taking a sample of fluid from the back of your nose and throat (nasopharyngeal fluid), your nose, or your throat using a swab. ? A sample of mucus from your lungs (sputum). ? Blood tests.  Imaging tests, which  may include, X-rays, CT scan, or ultrasound. How is this treated? At present, there is no medicine to treat COVID-19. Medicines that treat other diseases are being used on a trial basis to see if they are effective against COVID-19. Your health care provider will talk with you about ways to treat your symptoms. For most people, the infection is mild and can be managed at home with rest, fluids, and over-the-counter medicines. Treatment for a serious infection usually takes places in a hospital intensive care unit (ICU). It may include one or more of the following treatments. These treatments are given until your symptoms improve.  Receiving fluids and medicines through an IV.  Supplemental oxygen. Extra oxygen is given through a tube in the nose, a face mask, or a hood.  Positioning you to lie on your stomach (prone position). This makes it easier for oxygen to get into the lungs.  Continuous positive airway pressure (CPAP) or bi-level positive airway pressure (BPAP) machine. This treatment uses mild air pressure to keep the airways open. A tube that is connected to a motor delivers oxygen to the body.  Ventilator. This treatment moves air into and out of the lungs by using a tube that is placed in your windpipe.  Tracheostomy. This is a procedure to create a hole in the neck so that a breathing tube can be inserted.  Extracorporeal membrane oxygenation (ECMO). This procedure gives the lungs a chance to recover by taking over the functions of the heart and lungs. It supplies oxygen to the body and removes carbon dioxide. Follow these instructions at home: Lifestyle  If you are sick, stay home except to get medical care. Your health care provider will tell you how long to stay home. Call your health care provider before you go for medical care.  Rest at home as told by your health care provider.  Do not use any products that contain nicotine or tobacco, such as cigarettes, e-cigarettes, and  chewing tobacco. If you need help quitting, ask your health care provider.  Return to your normal activities as told by your health care provider. Ask your health care provider what activities are safe for you. General instructions  Take over-the-counter and prescription medicines only as told by your health care provider.  Drink enough fluid to keep your urine pale yellow.  Keep all follow-up visits as told by your health care provider. This is important. How is this prevented?  There is no vaccine to help prevent COVID-19 infection. However, there are steps you can take to protect yourself and others from this virus. To protect yourself:   Do not travel to areas where COVID-19 is a risk. The areas where COVID-19 is reported change often. To identify high-risk areas and travel restrictions, check the CDC travel website: wwwnc.cdc.gov/travel/notices  If you live in, or must travel to, an area where COVID-19 is a risk, take precautions to avoid infection. ? Stay away from people who are sick. ? Wash your hands often with soap and water for 20 seconds. If soap and water   are not available, use an alcohol-based hand sanitizer. ? Avoid touching your mouth, face, eyes, or nose. ? Avoid going out in public, follow guidance from your state and local health authorities. ? If you must go out in public, wear a cloth face covering or face mask. Make sure your mask covers your nose and mouth. ? Avoid crowded indoor spaces. Stay at least 6 feet (2 meters) away from others. ? Disinfect objects and surfaces that are frequently touched every day. This may include:  Counters and tables.  Doorknobs and light switches.  Sinks and faucets.  Electronics, such as phones, remote controls, keyboards, computers, and tablets. To protect others: If you have symptoms of COVID-19, take steps to prevent the virus from spreading to others.  If you think you have a COVID-19 infection, contact your health care  provider right away. Tell your health care team that you think you may have a COVID-19 infection.  Stay home. Leave your house only to seek medical care. Do not use public transport.  Do not travel while you are sick.  Wash your hands often with soap and water for 20 seconds. If soap and water are not available, use alcohol-based hand sanitizer.  Stay away from other members of your household. Let healthy household members care for children and pets, if possible. If you have to care for children or pets, wash your hands often and wear a mask. If possible, stay in your own room, separate from others. Use a different bathroom.  Make sure that all people in your household wash their hands well and often.  Cough or sneeze into a tissue or your sleeve or elbow. Do not cough or sneeze into your hand or into the air.  Wear a cloth face covering or face mask. Make sure your mask covers your nose and mouth. Where to find more information  Centers for Disease Control and Prevention: www.cdc.gov/coronavirus/2019-ncov/index.html  World Health Organization: www.who.int/health-topics/coronavirus Contact a health care provider if:  You live in or have traveled to an area where COVID-19 is a risk and you have symptoms of the infection.  You have had contact with someone who has COVID-19 and you have symptoms of the infection. Get help right away if:  You have trouble breathing.  You have pain or pressure in your chest.  You have confusion.  You have bluish lips and fingernails.  You have difficulty waking from sleep.  You have symptoms that get worse. These symptoms may represent a serious problem that is an emergency. Do not wait to see if the symptoms will go away. Get medical help right away. Call your local emergency services (911 in the [place] Do not drive yourself to the hospital. Let the emergency medical personnel know if you think you have COVID-19. Summary  COVID-19 is a  respiratory infection that is caused by a virus. It is also known as coronavirus disease or novel coronavirus. It can cause serious infections, such as pneumonia, acute respiratory distress syndrome, acute respiratory failure, or sepsis.  The virus that causes COVID-19 is contagious. This means that it can spread from person to person through droplets from breathing, speaking, singing, coughing, or sneezing.  You are more likely to develop a serious illness if you are 50 years of age or older, have a weak immune system, live in a nursing home, or have chronic disease.  There is no medicine to treat COVID-19. Your health care provider will talk with you about ways to treat your symptoms.    Take steps to protect yourself and others from infection. Wash your hands often and disinfect objects and surfaces that are frequently touched every day. Stay away from people who are sick and wear a mask if you are sick. This information is not intended to replace advice given to you by your health care provider. Make sure you discuss any questions you have with your health care provider. Document Revised: 05/03/2019 Document Reviewed: 08/09/2018 Elsevier Patient Education  2020 Elsevier Inc.  

## 2020-07-21 NOTE — Telephone Encounter (Signed)
Agreed.  Thanks.  

## 2020-07-21 NOTE — Progress Notes (Signed)
Virtual Visit via Video Note  I connected with Kurt Holmes on 07/21/20 at  2:00 PM EST by a video enabled telemedicine application and verified that I am speaking with the correct person using two identifiers.  Location: Patient: Home Provider: Office  Person's participating in this call: Webb Silversmith, NP-C and [name]   I discussed the limitations of evaluation and management by telemedicine and the availability of in person appointments. The patient expressed understanding and agreed to proceed.  History of Present Illness:  Pt reports headache, runny nose, sore throat, cough, chest pressure, fever, chills and body aches. This started 3 days ago. The headache is located in his right temple. He describes the pain as throbbing and sharp. He denies associated dizziness or visual changes. He is blowing clear mucous out of his nose. The cough is dry and nonproductive. He denies chest pain but does reports some pressure and shortness of breath with exertion. He has tried TheraFlu, Ibuprofen and Tums with minimal relief of symptoms.  He did take a covid test at home yesterday, it was positive. He did not get a Covid vaccine.   Past Medical History:  Diagnosis Date  . History of chicken pox   . Melanoma in situ ([place]) 11/07/2017   R neck    Current Outpatient Medications  Medication Sig Dispense Refill  . diclofenac Sodium (VOLTAREN) 1 % GEL Apply 2 g topically 4 (four) times daily as needed. 100 g 2  . Omega-3 Fatty Acids (FISH OIL) 1000 MG CAPS Take 1 capsule (1,000 mg total) by mouth daily.  0   No current facility-administered medications for this visit.    No Known Allergies  Family History  Problem Relation Age of Onset  . Hypertension Mother   . Stroke Mother   . Dementia Father   . Hypertension Father   . CAD Father        blockages  . Hypertension Sister   . CAD Maternal Grandfather        bypass surgery  . Colon cancer Neg Hx   . Prostate cancer Neg Hx      Social History   Socioeconomic History  . Marital status: Married    Spouse name: Not on file  . Number of children: Not on file  . Years of education: Not on file  . Highest education level: Not on file  Occupational History  . Not on file  Tobacco Use  . Smoking status: Former Research scientist (life sciences)  . Smokeless tobacco: Former Systems developer  . Tobacco comment: in the teen years and twenties  Substance and Sexual Activity  . Alcohol use: No    Alcohol/week: 0.0 standard drinks  . Drug use: No  . Sexual activity: Not on file  Other Topics Concern  . 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 Denies fatigue, or abrupt weight changes.  HEENT: Pt reports runny nose and sore throat. Denies eye pain, eye redness, ear pain, ringing in the ears, wax buildup, nasal congestion, bloody nose.  Respiratory: Pt reports cough and shortness of breath. Denies difficulty breathing, or sputum production.   Cardiovascular: Pt reports chest pressure. Denies chest pain, chest tightness, palpitations or swelling in the hands or feet.  Gastrointestinal: Denies abdominal pain, bloating, constipation, diarrhea or blood in the stool.   No other specific complaints in a complete review of systems (except as listed in HPI above).    Observations/Objective:  Pulse (!) 106   Temp 99.4 F (37.4 C) (Temporal)   Wt Readings from Last 3 Encounters:  10/28/19 176 lb 3 oz (79.9 kg)  03/07/18 179 lb 8 oz (81.4 kg)  10/04/17 172 lb 4 oz (78.1 kg)    General: Appears his stated age, in NAD. HEENT: Head: normal  shape and size; Nose: no congestion noted ; Throat/Mouth: no hoarseness noted. Pulmonary/Chest: Normal effort. No respiratory distress.  Neurological: Alert and oriented.   BMET    Component Value Date/Time   NA 137 02/24/2020 0745   K 4.5 02/24/2020 0745   CL 102 02/24/2020 0745   CO2 26 02/24/2020 0745   GLUCOSE 112 (H) 02/24/2020 0745   BUN 13 02/24/2020 0745   CREATININE 1.19 02/24/2020 0745   CALCIUM 9.8 02/24/2020 0745   GFRNONAA 76 (L) 09/05/2012 1028   GFRAA 88 (L) 09/05/2012 1028    Lipid Panel     Component Value Date/Time   CHOL 211 (H) 02/24/2020 0745   TRIG 376.0 (H) 02/24/2020 0745   HDL 29.40 (L) 02/24/2020 0745   CHOLHDL 7 02/24/2020 0745   VLDL 75.2 (H) 02/24/2020 0745    CBC    Component Value Date/Time   WBC 8.3 09/05/2012 1028   RBC 4.86 09/05/2012 1028   HGB 15.2 09/05/2012 1028   HCT 44.2 09/05/2012 1028   PLT 245 09/05/2012 1028   MCV 90.9 09/05/2012 1028   MCH 31.3 09/05/2012 1028   MCHC 34.4 09/05/2012 1028   RDW 12.3 09/05/2012 1028   LYMPHSABS 2.9 09/05/2012 1028   MONOABS 0.4 09/05/2012 1028   EOSABS 0.0 09/05/2012 1028   BASOSABS 0.0 09/05/2012 1028    Hgb A1C Lab Results  Component Value Date   HGBA1C 5.9 02/24/2020       Assessment and Plan:  Acute Headache, Runny Nose, Sore Throat, Cough, Chest Pressure, SOB, Fever, Chills, Body Aches secondary to Covid 19:  RX for Pred Taper x 6 days No indication for abx at this time RX for Albuterol Q4-6H prn RX for Tussionex for cough at night Can start Vit C and Zinc OTC  Discussed the importance of masking, social distancing, frequent handwashing and self quarantine until symptoms improve  ER precautions discussed Follow Up Instructions:    I discussed the assessment and treatment plan with the patient. The patient was provided an opportunity to ask questions and all were answered. The patient agreed with the plan and demonstrated an understanding of the instructions.   The  patient was advised to call back or seek an in-person evaluation if the symptoms worsen or if the condition fails to improve as anticipated.    Nicki Reaper, NP

## 2020-07-26 ENCOUNTER — Encounter: Payer: Self-pay | Admitting: Family Medicine

## 2020-07-27 ENCOUNTER — Other Ambulatory Visit: Payer: No Typology Code available for payment source

## 2020-07-27 NOTE — Telephone Encounter (Signed)
Spoke to patient by telephone and was advised that he was feeling really bad when he sent the mychart message yesterday. Patient stated that he is feeling some better. Patient stated that he had a low grade fever today of 99.8, productive cough greenish/brown, some wheezing and body aches. Patient stated that he was vomiting last night but has not today. Patient stated that he does not have an appetite because he has loss his taste and smell. Patient stated that he had some SOB yesterday but that is better today. Patient was advised to rest, drink plenty of fluids and eat well balanced meals even though he does not have an appetite. Patient was given ER precautions and he verbalized understanding. Patient stated that he will be on mychart looking for a response or any other information. Pharmacy [facility]

## 2020-07-27 NOTE — Telephone Encounter (Signed)
Can you please call and triage him?

## 2021-10-15 ENCOUNTER — Ambulatory Visit (INDEPENDENT_AMBULATORY_CARE_PROVIDER_SITE_OTHER): Payer: No Typology Code available for payment source | Admitting: Family Medicine

## 2021-10-15 ENCOUNTER — Encounter: Payer: Self-pay | Admitting: Family Medicine

## 2021-10-15 VITALS — BP 122/82 | HR 96 | Temp 98.2°F | Ht 66.0 in | Wt 184.0 lb

## 2021-10-15 DIAGNOSIS — Z0000 Encounter for general adult medical examination without abnormal findings: Secondary | ICD-10-CM | POA: Diagnosis not present

## 2021-10-15 DIAGNOSIS — E785 Hyperlipidemia, unspecified: Secondary | ICD-10-CM

## 2021-10-15 DIAGNOSIS — H93A9 Pulsatile tinnitus, unspecified ear: Secondary | ICD-10-CM

## 2021-10-15 DIAGNOSIS — Z7189 Other specified counseling: Secondary | ICD-10-CM

## 2021-10-15 DIAGNOSIS — R739 Hyperglycemia, unspecified: Secondary | ICD-10-CM

## 2021-10-15 DIAGNOSIS — R5383 Other fatigue: Secondary | ICD-10-CM

## 2021-10-15 NOTE — Progress Notes (Signed)
CPE- See plan.  Routine anticipatory guidance given to patient.  See health maintenance.  The possibility exists that previously documented standard health maintenance information may have been brought forward from a previous encounter into this note.  If needed, that same information has been updated to reflect the current situation based on today's encounter.   ? ?Tetanus 2016 ?Flu d/w pt.  Encouraged.   ?PNA not due ?Shingles d/w pt.   ?covid vaccine prev done.  ?D/w patient QA:STMHDQQ for colon cancer screening, including IFOB vs. colonoscopy.  Risks and benefits of both were discussed and patient voiced understanding.  Pt elects to check on coverage for colonoscopy vs cologuard.   ?Prostate cancer screening and PSA options (with potential risks and benefits of testing vs not testing) were discussed along with recent recs/guidelines.  He declined testing PSA at this point.  Reasonable to defer at age 42.  ?Living will d/w pt.  Wife designated if patient were incapacitated.   ?Diet and exercise d/w pt.   ? ?H/o melanoma. Still seeing dermatology.  I will defer.  He agrees. ? ?Pulsatile tinnitus noted laying on R side, resolves laying on the L side.  Noted after an airline flight several months back, noted in the meantime now. ? ?Fatigue noted in the PM.  No ED.  H/o HLD.  ? ?PMH and SH reviewed ? ?Meds, vitals, and allergies reviewed.  ? ?ROS: Per HPI.  Unless specifically indicated otherwise in HPI, the patient denies: ? ?General: fever. ?Eyes: acute vision changes ?ENT: sore throat ?Cardiovascular: chest pain ?Respiratory: SOB ?GI: vomiting ?GU: dysuria ?Musculoskeletal: acute back pain ?Derm: acute rash ?Neuro: acute motor dysfunction ?Psych: worsening mood ?Endocrine: polydipsia ?Heme: bleeding ?Allergy: hayfever ? ?GEN: nad, alert and oriented ?HEENT: ncat ?NECK: supple w/o LA ?CV: rrr. ?PULM: ctab, no inc wob ?ABD: soft, +bs ?EXT: no edema ?SKIN: no acute rash ?TM wnl B.  He has TM movement on valsalva.    ?Carotids w/o bruit bilaterally. ?

## 2021-10-15 NOTE — Patient Instructions (Addendum)
Let me know about colonoscopy vs cologuard.   ?Take care.  Glad to see you. ?Check with your insurance to see if they will cover the shingles shot. ?Ask the front about a fasting lab visit, early AM.   ?

## 2021-10-17 ENCOUNTER — Telehealth: Payer: Self-pay | Admitting: Family Medicine

## 2021-10-17 DIAGNOSIS — R5383 Other fatigue: Secondary | ICD-10-CM | POA: Insufficient documentation

## 2021-10-17 DIAGNOSIS — H93A9 Pulsatile tinnitus, unspecified ear: Secondary | ICD-10-CM | POA: Insufficient documentation

## 2021-10-17 DIAGNOSIS — E785 Hyperlipidemia, unspecified: Secondary | ICD-10-CM | POA: Insufficient documentation

## 2021-10-17 NOTE — Assessment & Plan Note (Signed)
Living will d/w pt.  Wife designated if patient were incapacitated.   ?

## 2021-10-17 NOTE — Assessment & Plan Note (Signed)
Return for fasting labs.

## 2021-10-17 NOTE — Telephone Encounter (Signed)
Please call patient.  I have been considering his situation in the meantime.  I think it makes sense to get his labs done as planned but I want him to see the ENT clinic about his pulsatile tinnitus and I put in a referral.  He should get a call about scheduling.  Thanks. ?

## 2021-10-17 NOTE — Assessment & Plan Note (Signed)
I wanted to consider options in the meantime.  I think it makes sense for him to see ENT.  I put in the referral and we will call him about this. ?

## 2021-10-17 NOTE — Assessment & Plan Note (Signed)
Return for early a.m. labs. ?

## 2021-10-17 NOTE — Assessment & Plan Note (Signed)
Tetanus 2016 ?Flu d/w pt.  Encouraged.   ?PNA not due ?Shingles d/w pt.   ?covid vaccine prev done.  ?D/w patient QM:[id number] for colon cancer screening, including IFOB vs. colonoscopy.  Risks and benefits of both were discussed and patient voiced understanding.  Pt elects to check on coverage for colonoscopy vs cologuard.   ?Prostate cancer screening and PSA options (with potential risks and benefits of testing vs not testing) were discussed along with recent recs/guidelines.  He declined testing PSA at this point.  Reasonable to defer at age 51.  ?Living will d/w pt.  Wife designated if patient were incapacitated.   ?Diet and exercise d/w pt.   ?

## 2021-10-19 NOTE — Telephone Encounter (Signed)
[email or web address]

## 2021-10-19 NOTE — Telephone Encounter (Signed)
Relayed message to patient as written below 

## 2021-10-29 ENCOUNTER — Encounter: Payer: Self-pay | Admitting: *Deleted

## 2021-11-09 ENCOUNTER — Other Ambulatory Visit (INDEPENDENT_AMBULATORY_CARE_PROVIDER_SITE_OTHER): Payer: No Typology Code available for payment source

## 2021-11-09 DIAGNOSIS — R739 Hyperglycemia, unspecified: Secondary | ICD-10-CM

## 2021-11-09 DIAGNOSIS — E785 Hyperlipidemia, unspecified: Secondary | ICD-10-CM

## 2021-11-09 DIAGNOSIS — R5383 Other fatigue: Secondary | ICD-10-CM

## 2021-11-09 LAB — LIPID PANEL
Cholesterol: 195 mg/dL (ref 0–200)
HDL: 31.3 mg/dL — ABNORMAL LOW (ref >39.00)
NonHDL: 163.69
Total CHOL/HDL Ratio: 6
Triglycerides: 357 mg/dL — ABNORMAL HIGH (ref 0.0–149.0)
VLDL: 71.4 mg/dL — ABNORMAL HIGH (ref 0.0–40.0)

## 2021-11-09 LAB — CBC WITH DIFFERENTIAL/PLATELET
Basophils Absolute: 0.1 10*3/uL (ref 0.0–0.1)
Basophils Relative: 1.1 % (ref 0.0–3.0)
Eosinophils Absolute: 0.1 10*3/uL (ref 0.0–0.7)
Eosinophils Relative: 1 % (ref 0.0–5.0)
HCT: 43.7 % (ref 39.0–52.0)
Hemoglobin: 14.7 g/dL (ref 13.0–17.0)
Lymphocytes Relative: 47.7 % — ABNORMAL HIGH (ref 12.0–46.0)
Lymphs Abs: 2.6 10*3/uL (ref 0.7–4.0)
MCHC: 33.7 g/dL (ref 30.0–36.0)
MCV: 90.4 fl (ref 78.0–100.0)
Monocytes Absolute: 0.4 10*3/uL (ref 0.1–1.0)
Monocytes Relative: 8.1 % (ref 3.0–12.0)
Neutro Abs: 2.3 10*3/uL (ref 1.4–7.7)
Neutrophils Relative %: 42.1 % — ABNORMAL LOW (ref 43.0–77.0)
Platelets: 252 10*3/uL (ref 150.0–400.0)
RBC: 4.83 Mil/uL (ref 4.22–5.81)
RDW: 13.1 % (ref 11.5–15.5)
WBC: 5.5 10*3/uL (ref 4.0–10.5)

## 2021-11-09 LAB — COMPREHENSIVE METABOLIC PANEL
ALT: 32 U/L (ref 0–53)
AST: 20 U/L (ref 0–37)
Albumin: 4.4 g/dL (ref 3.5–5.2)
Alkaline Phosphatase: 78 U/L (ref 39–117)
BUN: 16 mg/dL (ref 6–23)
CO2: 29 mEq/L (ref 19–32)
Calcium: 9.9 mg/dL (ref 8.4–10.5)
Chloride: 101 mEq/L (ref 96–112)
Creatinine, Ser: 1.22 mg/dL (ref 0.40–1.50)
GFR: 69.29 mL/min (ref >60.00)
Glucose, Bld: 108 mg/dL — ABNORMAL HIGH (ref 70–99)
Potassium: 4.8 mEq/L (ref 3.5–5.1)
Sodium: 137 mEq/L (ref 135–145)
Total Bilirubin: 0.6 mg/dL (ref 0.2–1.2)
Total Protein: 7.2 g/dL (ref 6.0–8.3)

## 2021-11-09 LAB — HEMOGLOBIN A1C: Hgb A1c MFr Bld: 5.9 % (ref 4.6–6.5)

## 2021-11-09 LAB — TESTOSTERONE: Testosterone: 500.93 ng/dL (ref 300.00–890.00)

## 2021-11-09 LAB — LDL CHOLESTEROL, DIRECT: Direct LDL: 105 mg/dL

## 2021-11-09 LAB — TSH: TSH: 1.44 u[IU]/mL (ref 0.35–5.50)

## 2021-11-15 ENCOUNTER — Other Ambulatory Visit: Payer: Self-pay | Admitting: Family Medicine

## 2021-11-15 MED ORDER — FISH OIL 1000 MG PO CAPS: 1000.0000 mg | ORAL_CAPSULE | Freq: Two times a day (BID) | ORAL | Status: DC

## 2022-08-18 ENCOUNTER — Encounter: Payer: Self-pay | Admitting: Radiation Oncology

## 2023-05-21 ENCOUNTER — Emergency Department: Payer: No Typology Code available for payment source

## 2023-05-21 ENCOUNTER — Emergency Department
Admission: EM | Admit: 2023-05-21 | Discharge: 2023-05-21 | Disposition: A | Discharge status: Home / Self Care | Payer: No Typology Code available for payment source | Attending: Emergency Medicine | Admitting: Emergency Medicine

## 2023-05-21 ENCOUNTER — Ambulatory Visit
Admission: EM | Admit: 2023-05-21 | Discharge: 2023-05-21 | Disposition: A | Discharge status: Home / Self Care | Payer: No Typology Code available for payment source

## 2023-05-21 ENCOUNTER — Other Ambulatory Visit: Payer: Self-pay

## 2023-05-21 DIAGNOSIS — R519 Headache, unspecified: Secondary | ICD-10-CM

## 2023-05-21 DIAGNOSIS — R220 Localized swelling, mass and lump, head: Secondary | ICD-10-CM

## 2023-05-21 DIAGNOSIS — G44209 Tension-type headache, unspecified, not intractable: Secondary | ICD-10-CM | POA: Diagnosis not present

## 2023-05-21 LAB — BASIC METABOLIC PANEL
Anion gap: 9 (ref 5–15)
BUN: 14 mg/dL (ref 6–20)
CO2: 24 mmol/L (ref 22–32)
Calcium: 9.5 mg/dL (ref 8.9–10.3)
Chloride: 105 mmol/L (ref 98–111)
Creatinine, Ser: 1.04 mg/dL (ref 0.61–1.24)
GFR, Estimated: >60 mL/min (ref >60)
Glucose, Bld: 91 mg/dL (ref 70–99)
Potassium: 3.8 mmol/L (ref 3.5–5.1)
Sodium: 138 mmol/L (ref 135–145)

## 2023-05-21 LAB — CBC WITH DIFFERENTIAL/PLATELET
Abs Immature Granulocytes: 0.06 10*3/uL (ref 0.00–0.07)
Basophils Absolute: 0 10*3/uL (ref 0.0–0.1)
Basophils Relative: 1 %
Eosinophils Absolute: 0.1 10*3/uL (ref 0.0–0.5)
Eosinophils Relative: 1 %
HCT: 46.7 % (ref 39.0–52.0)
Hemoglobin: 15.8 g/dL (ref 13.0–17.0)
Immature Granulocytes: 1 %
Lymphocytes Relative: 33 %
Lymphs Abs: 2.9 10*3/uL (ref 0.7–4.0)
MCH: 30 pg (ref 26.0–34.0)
MCHC: 33.8 g/dL (ref 30.0–36.0)
MCV: 88.8 fL (ref 80.0–100.0)
Monocytes Absolute: 0.7 10*3/uL (ref 0.1–1.0)
Monocytes Relative: 7 %
Neutro Abs: 5.1 10*3/uL (ref 1.7–7.7)
Neutrophils Relative %: 57 %
Platelets: 242 10*3/uL (ref 150–400)
RBC: 5.26 MIL/uL (ref 4.22–5.81)
RDW: 11.9 % (ref 11.5–15.5)
WBC: 8.8 10*3/uL (ref 4.0–10.5)
nRBC: 0 % (ref 0.0–0.2)

## 2023-05-21 LAB — SEDIMENTATION RATE: Sed Rate: 7 mm/h (ref 0–20)

## 2023-05-21 NOTE — ED Provider Notes (Signed)
[facility] Provider Note    Event Date/Time   First MD Initiated Contact with Patient 05/21/23 1204     (approximate)   History   Headache   HPI Kurt Holmes is a 52 y.o. male presenting today for headache.  Patient states over the past 1.5 weeks he has had intermittent left-sided headache.  He has used Tylenol and Advil with some relief of symptoms.  Does not describe it as the worst headache of his life.  Most of the time mild.  He is unsure if he is having any vision changes to the left side.  He denies nausea, vomiting, dizziness, difficulty walking, ear pain, hearing loss, pain with palpation to the side of his face.     Physical Exam   Triage Vital Signs: ED Triage Vitals [05/21/23 1129]  Encounter Vitals Group     BP 113/80     Systolic BP Percentile      Diastolic BP Percentile      Pulse Rate 77     Resp 17     Temp (!) 97.5 F (36.4 C)     Temp Source Oral     SpO2 97 %     Weight 182 lb (82.6 kg)     Height 5\' 6"  (1.676 m)     Head Circumference      Peak Flow      Pain Score 3     Pain Loc      Pain Education      Exclude from Growth Chart     Most recent vital signs: Vitals:   05/21/23 1129  BP: 113/80  Pulse: 77  Resp: 17  Temp: (!) 97.5 F (36.4 C)  SpO2: 97%   I have reviewed the vital signs. General:  Awake, alert, no acute distress. Head:  Normocephalic, Atraumatic. EENT:  PERRL, EOMI, Oral mucosa pink and moist, Neck is supple.  No tenderness palpation over the temples bilaterally. Cardiovascular: Regular rate, 2+ distal pulses. Respiratory:  Normal respiratory effort, symmetrical expansion, no distress.   Extremities:  Moving all four extremities through full ROM without pain.   Neuro:  Alert and oriented.  Interacting appropriately.  Cranial nerves II through XII intact.  5 out of 5 strength to bilateral upper and lower extremities.  Sensation intact throughout all extremities. Skin:  Warm, dry, no rash.    Psych: Appropriate affect.    ED Results / Procedures / Treatments   Labs (all labs ordered are listed, but only abnormal results are displayed) Labs Reviewed  CBC WITH DIFFERENTIAL/PLATELET  BASIC METABOLIC PANEL  SEDIMENTATION RATE     EKG    RADIOLOGY Independently interpreted CT scan and agree with radiology read   PROCEDURES:  Critical Care performed: No  Procedures   MEDICATIONS ORDERED IN ED: Medications - No data to display   IMPRESSION / MDM / ASSESSMENT AND PLAN / ED COURSE  I reviewed the triage vital signs and the nursing notes.                              Differential diagnosis includes, but is not limited to, intracranial mass, tension headache, migraine headache, less likely cluster headache, less likely vertebral artery dissection, less likely temporal arteritis  Patient's presentation is most consistent with acute complicated illness / injury requiring diagnostic workup.  Patient is a 52 year old male presenting today for intermittent headaches over the past 1.5 weeks  which are largely mild in nature.  Describes some associated vision change in the left eye.  However, on visual acuity, had improved vision on the left eye compared to the right eye.  No other gross neurological abnormalities noted on exam.  Vital signs otherwise stable.  CT imaging with no acute pathology.  CBC, BMP unremarkable.  ESR negative with no concern for temporal arteritis.  Suspect symptoms at this time are likely related to tension or migraine headaches.  Patient is asymptomatic here at this time and safe for discharge and follow-up with his PCP.  Also told to follow-up with an eye doctor for intermittent vision issues regarding the left eye.  The patient is on the cardiac monitor to evaluate for evidence of arrhythmia and/or significant heart rate changes. Clinical Course as of 05/21/23 1414  Sun May 21, 2023  1303 CBC with Differential Unremarkable [DW]  1310 CT Head Wo  Contrast No acute abnormalities [DW]  1316 Basic metabolic panel Unremarkable [DW]  1355 Sed Rate: 7 [DW]    Clinical Course User Index [DW] Janith Lima, MD     FINAL CLINICAL IMPRESSION(S) / ED DIAGNOSES   Final diagnoses:  Tension headache     Rx / DC Orders   ED Discharge Orders     None        Note:  This document was prepared using Dragon voice recognition software and may include unintentional dictation errors.   Janith Lima, MD 05/21/23 1416

## 2023-05-21 NOTE — ED Triage Notes (Signed)
Pt states coming in with a headache for 1.5 weeks. Pt states pain is the left temporal area pain. Pt sent from urgent care.

## 2023-05-21 NOTE — ED Notes (Signed)
R 20/50  L 20/30

## 2023-05-21 NOTE — ED Triage Notes (Signed)
Patient to Urgent Care with complaints of headaches/ nasal congestion. Woke up this morning as though the left sided of his head feels swollen. Reports all of his congestion is left sided. Denies any fevers. High stress job.   Reports symptoms started at least one week ago. Taking ibuprofen with some relief.

## 2023-05-21 NOTE — Discharge Instructions (Signed)
Please follow-up with your primary care provider for ongoing management outpatient.  It may also be wise to see a eye doctor or ophthalmologist for further evaluation as well as this may be contributing to some of your headache symptoms.

## 2023-05-21 NOTE — ED Provider Notes (Signed)
Renal[facility] F[facility]ler    CSN: [id number] Arrival date & time: 05/21/23  1029      History   Chief Complaint Chief Complaint  Patient presents with   Nasal Congestion   Headache    HPI Kurt Holmes is a 52 y.o. male.  Patient presents with acute stabbing headache on the left side and swelling of the left temporal area this morning.  No falls or injury.  This is not the worst headache of his life.  No OTC medications taken today.  His blood pressure earlier this week was 160/100.  He had left side sinus congestion earlier this week but none currently.  No numbness, weakness, chest pain, shortness of breath, dizziness, or other symptoms.  His medical history includes hyperlipidemia.  The history is provided by the spouse, the patient and medical records.    Past Medical History:  Diagnosis Date   BCC (basal cell carcinoma of skin)    History of chicken pox    Melanoma in situ (HCC) 11/07/2017   R neck   SCC (squamous cell carcinoma)     Patient Active Problem List   Diagnosis Date Noted   HLD (hyperlipidemia) 10/17/2021   Other fatigue 10/17/2021   Pulsatile tinnitus 10/17/2021   Routine general medical examination at a health care facility 10/30/2019   Advance care planning 10/30/2019   Hand pain 10/30/2019   Melanoma in situ (HCC) 11/20/2017   Snoring 05/29/2015   Hyperglycemia 10/30/2014    Past Surgical History:  Procedure Laterality Date   KNEE SURGERY     SHOULDER SURGERY     WISDOM TOOTH EXTRACTION         Home Medications    Prior to Admission medications   Medication Sig Start Date End Date Taking? Authorizing Provider  Omega-3 Fatty Acids (FISH OIL) 1000 MG CAPS Take 1 capsule (1,000 mg total) by mouth in the morning and at bedtime. 11/15/21   Joaquim Nam, MD    Family History Family History  Problem Relation Age of Onset   Hypertension Mother    Stroke Mother    Dementia Father    Hypertension Father    CAD Father        blockages    Hypertension Sister    CAD Maternal Grandfather        bypass surgery   Colon cancer Neg Hx    Prostate cancer Neg Hx     Social History Social History   Tobacco Use   Smoking status: Former   Smokeless tobacco: Former   Tobacco comments:    in the teen years and twenties  Substance Use Topics   Alcohol use: No    Alcohol/week: 0.0 standard drinks of alcohol   Drug use: No     Allergies   Patient has no known allergies.   Review of Systems Review of Systems  Constitutional:  Negative for chills and fever.  HENT:  Negative for ear pain and sore throat.   Respiratory:  Negative for cough and shortness of breath.   Cardiovascular:  Negative for chest pain and palpitations.  Skin:  Negative for color change, rash and wound.  Neurological:  Positive for headaches. Negative for dizziness, syncope, facial asymmetry, speech difficulty, weakness and numbness.     Physical Exam Triage Vital Signs ED Triage Vitals [05/21/23 1041]  Encounter Vitals Group     BP      Systolic BP Percentile      Diastolic BP Percentile  Pulse Rate 80     Resp 18     Temp 98 F (36.7 C)     Temp src      SpO2 97 %     Weight      Height      Head Circumference      Peak Flow      Pain Score      Pain Loc      Pain Education      Exclude from Growth Chart    No data found.  Updated Vital Signs BP 135/64   Pulse 80   Temp 98 F (36.7 C)   Resp 18   SpO2 97%   Visual Acuity Right Eye Distance:   Left Eye Distance:   Bilateral Distance:    Right Eye Near:   Left Eye Near:    Bilateral Near:     Physical Exam Constitutional:      General: He is not in acute distress. HENT:     Head:     Comments: Mild tenderness and edema of left temporal area.      Right Ear: Tympanic membrane normal.     Left Ear: Tympanic membrane normal.     Nose: Nose normal.     Mouth/Throat:     Mouth: Mucous membranes are moist.     Pharynx: Oropharynx is clear.  Eyes:     Pupils:  Pupils are equal, round, and reactive to light.  Cardiovascular:     Rate and Rhythm: Normal rate and regular rhythm.     Heart sounds: Normal heart sounds.  Pulmonary:     Effort: Pulmonary effort is normal. No respiratory distress.     Breath sounds: Normal breath sounds.  Skin:    General: Skin is warm and dry.     Findings: No bruising, erythema, lesion or rash.  Neurological:     General: No focal deficit present.     Mental Status: He is alert and oriented to person, place, and time.     Cranial Nerves: No cranial nerve deficit.     Sensory: No sensory deficit.     Motor: No weakness.     Coordination: Romberg sign negative.     Gait: Gait normal.  Psychiatric:        Mood and Affect: Mood normal.        Behavior: Behavior normal.      UC Treatments / Results  Labs (all labs ordered are listed, but only abnormal results are displayed) Labs Reviewed - No data to display  EKG   Radiology No results found.  Procedures Procedures (including critical care time)  Medications Ordered in UC Medications - No data to display  Initial Impression / Assessment and Plan / UC Course  I have reviewed the triage vital signs and the nursing notes.  Pertinent labs & imaging results that were available during my care of the patient were reviewed by me and considered in my medical decision making (see chart for details).    Acute headache, tenderness and swelling of the left temporal area.  Afebrile and vital signs are stable.  Blood pressure is normal at this time.  Discussed treatment options and limitations of evaluation of his symptoms in an urgent care setting.  Patient and his wife opt to go to the ED for evaluation.  Final Clinical Impressions(s) / UC Diagnoses   Final diagnoses:  Acute nonintractable headache, unspecified headache type  Swelling of head   Discharge  Instructions   None    ED Prescriptions   None    PDMP not reviewed this encounter.   Mickie Bail, NP 05/21/23 1121

## 2023-07-24 ENCOUNTER — Other Ambulatory Visit: Payer: Self-pay

## 2023-07-24 ENCOUNTER — Emergency Department: Payer: No Typology Code available for payment source

## 2023-07-24 DIAGNOSIS — Z5321 Procedure and treatment not carried out due to patient leaving prior to being seen by health care provider: Secondary | ICD-10-CM | POA: Insufficient documentation

## 2023-07-24 DIAGNOSIS — R200 Anesthesia of skin: Secondary | ICD-10-CM | POA: Diagnosis present

## 2023-07-24 LAB — CBC
HCT: 43.5 % (ref 39.0–52.0)
Hemoglobin: 15 g/dL (ref 13.0–17.0)
MCH: 31.4 pg (ref 26.0–34.0)
MCHC: 34.5 g/dL (ref 30.0–36.0)
MCV: 91 fL (ref 80.0–100.0)
Platelets: 263 10*3/uL (ref 150–400)
RBC: 4.78 MIL/uL (ref 4.22–5.81)
RDW: 12 % (ref 11.5–15.5)
WBC: 7.9 10*3/uL (ref 4.0–10.5)
nRBC: 0 % (ref 0.0–0.2)

## 2023-07-24 LAB — BASIC METABOLIC PANEL
Anion gap: 13 (ref 5–15)
BUN: 14 mg/dL (ref 6–20)
CO2: 25 mmol/L (ref 22–32)
Calcium: 9.3 mg/dL (ref 8.9–10.3)
Chloride: 99 mmol/L (ref 98–111)
Creatinine, Ser: 1.23 mg/dL (ref 0.61–1.24)
GFR, Estimated: >60 mL/min (ref >60)
Glucose, Bld: 119 mg/dL — ABNORMAL HIGH (ref 70–99)
Potassium: 3.6 mmol/L (ref 3.5–5.1)
Sodium: 137 mmol/L (ref 135–145)

## 2023-07-24 LAB — TROPONIN I (HIGH SENSITIVITY): Troponin I (High Sensitivity): 5 ng/L (ref <18)

## 2023-07-24 NOTE — ED Notes (Signed)
 Pt came up to stat desk and stated, "I got here about two hours ago. If I was having a heart attack, I would've gotten back by now right?" Pt stated "Im out." Pt directed to [facility] first nurse about decision to LWBS.

## 2023-07-24 NOTE — ED Triage Notes (Addendum)
 Pt reports left arm numbness that goes into his back and chest that began a few hours ago. Pt reports he was working on the computer when it began, pt denies cardiac hx. Pt stats he took his BP at home and it was 170/107.Pt denies hx HTN. Denies chest pain or sob.

## 2023-07-25 ENCOUNTER — Emergency Department
Admission: EM | Admit: 2023-07-25 | Discharge: 2023-07-25 | Discharge status: Against Medical Advice | Payer: No Typology Code available for payment source | Attending: Emergency Medicine | Admitting: Emergency Medicine

## 2023-08-22 ENCOUNTER — Encounter: Payer: No Typology Code available for payment source | Admitting: Family Medicine

## 2024-03-03 ENCOUNTER — Other Ambulatory Visit: Payer: Self-pay

## 2024-03-03 ENCOUNTER — Emergency Department

## 2024-03-03 ENCOUNTER — Emergency Department
Admission: EM | Admit: 2024-03-03 | Discharge: 2024-03-03 | Disposition: A | Discharge status: Home / Self Care | Attending: Emergency Medicine | Admitting: Emergency Medicine

## 2024-03-03 ENCOUNTER — Encounter: Payer: Self-pay | Admitting: Emergency Medicine

## 2024-03-03 DIAGNOSIS — R079 Chest pain, unspecified: Secondary | ICD-10-CM | POA: Diagnosis present

## 2024-03-03 DIAGNOSIS — I10 Essential (primary) hypertension: Secondary | ICD-10-CM | POA: Diagnosis not present

## 2024-03-03 LAB — D-DIMER, QUANTITATIVE: D-Dimer, Quant: <0.27 ug{FEU}/mL (ref 0.00–0.50)

## 2024-03-03 LAB — BASIC METABOLIC PANEL WITH GFR
Anion gap: 11 (ref 5–15)
BUN: 15 mg/dL (ref 6–20)
CO2: 25 mmol/L (ref 22–32)
Calcium: 9.7 mg/dL (ref 8.9–10.3)
Chloride: 102 mmol/L (ref 98–111)
Creatinine, Ser: 1.1 mg/dL (ref 0.61–1.24)
GFR, Estimated: >60 mL/min (ref >60)
Glucose, Bld: 104 mg/dL — ABNORMAL HIGH (ref 70–99)
Potassium: 4 mmol/L (ref 3.5–5.1)
Sodium: 138 mmol/L (ref 135–145)

## 2024-03-03 LAB — CBC
HCT: 46.2 % (ref 39.0–52.0)
Hemoglobin: 15.5 g/dL (ref 13.0–17.0)
MCH: 29.9 pg (ref 26.0–34.0)
MCHC: 33.5 g/dL (ref 30.0–36.0)
MCV: 89.2 fL (ref 80.0–100.0)
Platelets: 244 K/uL (ref 150–400)
RBC: 5.18 MIL/uL (ref 4.22–5.81)
RDW: 11.9 % (ref 11.5–15.5)
WBC: 6.2 K/uL (ref 4.0–10.5)
nRBC: 0 % (ref 0.0–0.2)

## 2024-03-03 LAB — TROPONIN I (HIGH SENSITIVITY)
Troponin I (High Sensitivity): 4 ng/L (ref <18)
Troponin I (High Sensitivity): 4 ng/L (ref <18)

## 2024-03-03 MED ORDER — AMLODIPINE BESYLATE 5 MG PO TABS: 5.0000 mg | ORAL_TABLET | Freq: Every day | ORAL | 2 refills | Status: DC

## 2024-03-03 NOTE — ED Provider Notes (Signed)
 [facility] Provider Note    Event Date/Time   First MD Initiated Contact with Patient 03/03/24 [phone]     (approximate)   History   Chief Complaint Chest Pain   HPI  Kurt Holmes is a 53 y.o. male with past medical history of hyperlipidemia who presents to the ED complaining of chest pain.  Patient reports that he initially felt well after waking up this morning, then had sudden onset of sharp pain in the middle of his back radiating towards his left shoulder and his chest.  He states that the pain caused him to feel slightly short of breath, but pain has eased up since he has arrived to the ED and he no longer has any difficulty breathing.  He reports occasional similar episodes in the past, has never seen a physician for it.  He denies any fevers or cough and has not had any pain or swelling in his legs.  He does report regularly lifting 50 to 60 pounds as part of his work, but has not had any more exertion than usual recently.     Physical Exam   Triage Vital Signs: ED Triage Vitals  Encounter Vitals Group     BP 03/03/24 0931 (!) 171/112     Girls Systolic BP Percentile --      Girls Diastolic BP Percentile --      Boys Systolic BP Percentile --      Boys Diastolic BP Percentile --      Pulse Rate 03/03/24 0931 75     Resp 03/03/24 0931 17     Temp 03/03/24 0931 98 F (36.7 C)     Temp Source 03/03/24 0931 Oral     SpO2 03/03/24 0931 100 %     Weight --      Height --      Head Circumference --      Peak Flow --      Pain Score 03/03/24 0929 4     Pain Loc --      Pain Education --      Exclude from Growth Chart --     Most recent vital signs: Vitals:   03/03/24 1215 03/03/24 1230  BP:  (!) 162/106  Pulse: 79 80  Resp: 12   Temp:    SpO2: 99% 98%    Constitutional: Alert and oriented. Eyes: Conjunctivae are normal. Head: Atraumatic. Nose: No congestion/rhinnorhea. Mouth/Throat: Mucous membranes are moist.  Cardiovascular:  Normal rate, regular rhythm. Grossly normal heart sounds.  2+ radial pulses bilaterally. Respiratory: Normal respiratory effort.  No retractions. Lungs CTAB. Gastrointestinal: Soft and nontender. No distention. Musculoskeletal: No lower extremity tenderness nor edema.  Tenderness to palpation noted just medial to left shoulder blade. Neurologic:  Normal speech and language. No gross focal neurologic deficits are appreciated.    ED Results / Procedures / Treatments   Labs (all labs ordered are listed, but only abnormal results are displayed) Labs Reviewed  BASIC METABOLIC PANEL WITH GFR - Abnormal; Notable for the following components:      Result Value   Glucose, Bld 104 (*)    All other components within normal limits  CBC  D-DIMER, QUANTITATIVE  TROPONIN I (HIGH SENSITIVITY)  TROPONIN I (HIGH SENSITIVITY)     EKG  ED ECG REPORT I, Carlin Palin, the attending physician, personally viewed and interpreted this ECG.   Date: 03/03/2024  EKG Time: 9:29  Rate: 76  Rhythm: normal sinus rhythm  Axis:  Normal  Intervals:none  ST&T Change: None  RADIOLOGY Chest x-ray reviewed and interpreted by me with no infiltrate, edema, or effusion.  PROCEDURES:  Critical Care performed: No  Procedures   MEDICATIONS ORDERED IN ED: Medications - No data to display   IMPRESSION / MDM / ASSESSMENT AND PLAN / ED COURSE  I reviewed the triage vital signs and the nursing notes.                              53 y.o. male with past medical history of hyperlipidemia who presents to the ED complaining of sudden onset of discomfort in his left upper back radiating towards his chest.  Patient's presentation is most consistent with acute presentation with potential threat to life or bodily function.  Differential diagnosis includes, but is not limited to, ACS, PE, dissection, pneumonia, pneumothorax, musculoskeletal pain, GERD, anxiety.  Patient nontoxic-appearing and in no acute distress,  vital signs remarkable for hypertension but otherwise reassuring.  Patient does report being told he has borderline hypertension in the past, but he does not take any medication for it.  EKG shows no evidence of arrhythmia or ischemia and initial troponin within normal limits, will check second set.  Additional labs without significant anemia, leukocytosis, electrolyte abnormality, or AKI.  Chest x-ray also unremarkable.  Pain is reproducible with palpation and musculoskeletal etiology seems most likely, but will check D-dimer with his difficulty breathing.  D-dimer within normal limits, 2 sets of troponin are also reassuring.  He continues to deny any ongoing difficulty breathing on reassessment, suspect musculoskeletal etiology for his pain given is reproducible with palpation.  He is appropriate for outpatient management with PCP follow-up, will start on amlodipine  given he remains hypertensive.  No evidence of hypertensive emergency and he was counseled to return to the ED for new or worsening symptoms, patient agrees with plan.      FINAL CLINICAL IMPRESSION(S) / ED DIAGNOSES   Final diagnoses:  Nonspecific chest pain  Uncontrolled hypertension     Rx / DC Orders   ED Discharge Orders          Ordered    amLODipine  (NORVASC ) 5 MG tablet  Daily        03/03/24 1345             Note:  This document was prepared using Dragon voice recognition software and may include unintentional dictation errors.   Willo Dunnings, MD 03/03/24 1346

## 2024-03-03 NOTE — ED Triage Notes (Signed)
 Pt reports mid back pain that radiates to the chest that started this morning after waking. PT does reports some SHOB associated with the pain.

## 2024-03-03 NOTE — ED Notes (Signed)
 Pt declines SOB or chest pain at this time. Pt states still a slight discomfort in back. No needs requested

## 2024-03-04 ENCOUNTER — Encounter: Payer: Self-pay | Admitting: Internal Medicine

## 2024-03-04 ENCOUNTER — Ambulatory Visit (INDEPENDENT_AMBULATORY_CARE_PROVIDER_SITE_OTHER): Admitting: Internal Medicine

## 2024-03-04 ENCOUNTER — Ambulatory Visit: Payer: Self-pay | Admitting: *Deleted

## 2024-03-04 VITALS — BP 122/80 | HR 89 | Temp 98.3°F | Ht 66.0 in | Wt 174.0 lb

## 2024-03-04 DIAGNOSIS — I10 Essential (primary) hypertension: Secondary | ICD-10-CM | POA: Insufficient documentation

## 2024-03-04 MED ORDER — AMLODIPINE BESYLATE 5 MG PO TABS: 5.0000 mg | ORAL_TABLET | Freq: Every day | ORAL | 1 refills | Status: DC

## 2024-03-04 NOTE — Telephone Encounter (Signed)
 FYI Only Patient is seeing Dr. Jimmy today 03/04/2024

## 2024-03-04 NOTE — Assessment & Plan Note (Signed)
 BP Readings from Last 3 Encounters:  03/04/24 122/80  03/03/24 (!) 155/102  07/24/23 (!) 158/95   Has noted BP elevation worsening Repeat on left by me 138/88 Took the first dose of amlodipine  today---will continue

## 2024-03-04 NOTE — Telephone Encounter (Signed)
 Noted. Thanks.  I have been out of the office unexpectedly and I plan to return to clinic Tuesday.  I am reponding to messages as quickily as I can in the menatime.

## 2024-03-04 NOTE — Telephone Encounter (Signed)
 FYI Only or Action Required?: FYI only for provider.  Patient was last seen in primary care on 10/15/2021 by Cleatus Arlyss RAMAN, MD.  Called Nurse Triage reporting Hypertension.  Symptoms began today.  Interventions attempted: Prescription medications: amlodipine .  Symptoms are: unchanged.  Triage Disposition: See Physician Within 24 Hours  Patient/caregiver understands and will follow disposition?: Yes      Copied from CRM #[id number]. Topic: Clinical - Red Word Triage >> Mar 04, 2024  8:14 AM Ivette P wrote: [facility] that prompted transfer to Nurse Triage: Hospital yesterday - scheduling hospital follow up.   185/114 - thats what it was at hospital - was prescribed 5MG  amlodipine     146/103 - a couple minutes ago. Still feeling like he did yesterday. Reason for Disposition  Systolic BP >= 180 OR Diastolic >= 110  Answer Assessment - Initial Assessment Questions Scheduled appt today due to elevated BP and patient off work.   Appt scheduled today none available with PCP until Friday . Requesting hospital f/u appt and due to acute BP scheduled appt today . Recommended if sx noted go back to ED>    1. BLOOD PRESSURE: What is your blood pressure? Did you take at least two measurements 5 minutes apart?     BP 146/103 rechecked for BP 189/96 after 20 minutes taking amlodipine  2. ONSET: When did you take your blood pressure?     This am  3. HOW: How did you take your blood pressure? (e.g., automatic home BP monitor, visiting nurse)     Automatic BP monitor 4. HISTORY: Do you have a history of high blood pressure?     Found out yesterday at ED. 5. MEDICINES: Are you taking any medicines for blood pressure? Have you missed any doses recently?     Prescribed amlodipine   5 mg  6. OTHER SYMPTOMS: Do you have any symptoms? (e.g., blurred vision, chest pain, difficulty breathing, headache, weakness)     Denies sx now   reports high stress job. 7. PREGNANCY: Is there any  chance you are pregnant? When was your last menstrual period?     na  Protocols used: Blood Pressure - High-A-AH

## 2024-03-04 NOTE — Progress Notes (Signed)
 Subjective:    Patient ID: Kurt Holmes, male    DOB: Sep 13, 1970, 53 y.o.   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 30 tablet 2   No current facility-administered medications on file prior to visit.    No Known Allergies  Past Medical History:  Diagnosis Date   BCC (basal cell carcinoma of skin)    History of chicken pox    Melanoma in situ (HCC) 11/07/2017   R neck   SCC (squamous cell carcinoma)     Past Surgical History:  Procedure Laterality Date   KNEE SURGERY     SHOULDER SURGERY     WISDOM TOOTH EXTRACTION      Family History  Problem Relation Age of Onset   Hypertension Mother    Stroke Mother    Dementia Father    Hypertension Father    CAD Father        blockages   Hypertension Sister    CAD Maternal Grandfather        bypass surgery   Colon cancer Neg Hx    Prostate cancer Neg Hx     Social History   Socioeconomic History   Marital status: Married    Spouse name: Not on file   Number of children: Not on file   Years of education: Not on file   Highest education level: Not on file  Occupational History   Not on file  Tobacco Use   Smoking status: Former   Smokeless tobacco: Former   Tobacco comments:    in the teen years and twenties  Substance and Sexual Activity    Alcohol use: No    Alcohol/week: 0.0 standard drinks of alcohol   Drug use: No   Sexual activity: Not on file  Other Topics Concern   Not on file  Social History Narrative   Likes to deer hunt, prev coach baseball   Married 2002   4 kids   [employer] fan   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     Social Drivers of Corporate investment banker Strain: Not on file  Food Insecurity: Not on file  Transportation Needs: Not on file  Physical Activity: Not on file  Stress: Not on file  Social Connections: Not on file  Intimate Partner Violence: Not on file   Review of Systems Doesn't use salt Not doing much exercise    Objective:   Physical Exam Constitutional:      Appearance: Normal appearance.  Cardiovascular:     Rate and Rhythm: Normal rate and regular rhythm.     Heart sounds: No murmur heard.    No gallop.  Pulmonary:     Effort: Pulmonary effort is normal.     Breath sounds: Normal breath sounds.  No wheezing or rales.  Musculoskeletal:     Cervical back: Neck supple.     Right lower leg: No edema.     Left lower leg: No edema.  Lymphadenopathy:     Cervical: No cervical adenopathy.  Neurological:     Mental Status: He is alert.            Assessment & Plan:

## 2024-03-21 ENCOUNTER — Ambulatory Visit (INDEPENDENT_AMBULATORY_CARE_PROVIDER_SITE_OTHER): Admitting: Family Medicine

## 2024-03-21 ENCOUNTER — Encounter: Payer: Self-pay | Admitting: Family Medicine

## 2024-03-21 VITALS — BP 132/82 | HR 88 | Temp 98.5°F | Ht 66.0 in | Wt 173.8 lb

## 2024-03-21 DIAGNOSIS — I10 Essential (primary) hypertension: Secondary | ICD-10-CM

## 2024-03-21 NOTE — Patient Instructions (Addendum)
 Your recheck BP was fine.   Let me know if you have trouble getting the rx filled.   If persistently lightheaded, then cut the amlodipine  in half.   Try to limit salt.  Take care.  Glad to see you.

## 2024-03-21 NOTE — Progress Notes (Signed)
 Had ER eval for HTN and CP.  Neg troponin and D-dimer.   Discussed ER course.  Job stressors d/w pt.  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  He is working on Museum/gallery exhibitions officer.  No SI/HI.    His mother needed extra care, moved into a facility.  Discussed.  Not having chest pain now.  He thought he had pulled a muscle, in retrospect.  No BLE edema.  Still on amlodipine .  Prev lightheadedness resolved, he had that with starting med.  Not an issue now.    Meds, vitals, and allergies reviewed.   ROS: Per HPI unless specifically indicated in ROS section   GEN: nad, alert and oriented HEENT: mucous membranes moist NECK: supple w/o LA CV: rrr. PULM: ctab, no inc wob ABD: soft, +bs EXT: no edema SKIN: Well-perfused

## 2024-03-23 NOTE — Assessment & Plan Note (Signed)
 Blood pressure improved. Continue amlodipine . He can let me know if he is trouble getting the rx filled.   If persistently lightheaded, then cut the amlodipine  in half.   Try to limit salt.  Update me as needed.  He agrees with plan.

## 2024-04-16 ENCOUNTER — Ambulatory Visit (INDEPENDENT_AMBULATORY_CARE_PROVIDER_SITE_OTHER): Admitting: Family Medicine

## 2024-04-16 ENCOUNTER — Encounter: Payer: Self-pay | Admitting: Family Medicine

## 2024-04-16 VITALS — BP 138/84 | HR 84 | Temp 98.1°F | Ht 66.0 in | Wt 174.2 lb

## 2024-04-16 DIAGNOSIS — Z1211 Encounter for screening for malignant neoplasm of colon: Secondary | ICD-10-CM

## 2024-04-16 DIAGNOSIS — Z7189 Other specified counseling: Secondary | ICD-10-CM

## 2024-04-16 DIAGNOSIS — Z0000 Encounter for general adult medical examination without abnormal findings: Secondary | ICD-10-CM

## 2024-04-16 DIAGNOSIS — I10 Essential (primary) hypertension: Secondary | ICD-10-CM

## 2024-04-16 MED ORDER — AMLODIPINE BESYLATE 5 MG PO TABS: 5.0000 mg | ORAL_TABLET | Freq: Every day | ORAL | 3 refills | Status: AC

## 2024-04-16 NOTE — Patient Instructions (Signed)
 I would get a flu shot each fall.   Please send me a copy of your labs.  Take care.  Glad to see you. Call about an eye appointment when possible.

## 2024-04-16 NOTE — Progress Notes (Unsigned)
 CPE- See plan.  Routine anticipatory guidance given to patient.  See health maintenance.  The possibility exists that previously documented standard health maintenance information may have been brought forward from a previous encounter into this note.  If needed, that same information has been updated to reflect the current situation based on today's encounter.    Tetanus 2016 Flu d/w pt.  Encouraged.   PNA d/w pt.   Shingles d/w pt.   covid vaccine prev done.  D/w patient mz:neupnwd for colon cancer screening, including IFOB vs. Cologuard vs colonoscopy.  Risks and benefits were discussed and patient voiced understanding.   He opted for Cologuard, ordered 2025. Prostate cancer screening and PSA options (with potential risks and benefits of testing vs not testing) were discussed along with recent recs/guidelines.  He declined testing PSA at this point.  Reasonable to defer at age <55.  Living will d/w pt.  Wife designated if patient were incapacitated.   Diet and exercise d/w pt.    He had labs done at work.  I asked him to get me a copy.  Defer labs now.    Hypertension:    Using medication without problems or lightheadedness: occ if prolonged fasting but that is better now.   Chest pain with exertion:no Edema:no Short of breath:no D/w pt about prev job change.  He is in sales now.    He has dermatology f/u pending. He has a lesion on the L pinna that he is going to get evaluated.  Small scabbed area.  It looks like a small AK.    PMH and SH reviewed  Meds, vitals, and allergies reviewed.   ROS: Per HPI.  Unless specifically indicated otherwise in HPI, the patient denies:  General: fever. Eyes: acute vision changes ENT: sore throat Cardiovascular: chest pain Respiratory: SOB GI: vomiting GU: dysuria Musculoskeletal: acute back pain Derm: acute rash Neuro: acute motor dysfunction Psych: worsening mood Endocrine: polydipsia Heme: bleeding Allergy: hayfever  GEN: nad, alert  and oriented HEENT: mucous membranes moist NECK: supple w/o LA CV: rrr. PULM: ctab, no inc wob ABD: soft, +bs EXT: no edema SKIN: no acute rash Likely small AK on the L pinna.

## 2024-04-19 NOTE — Assessment & Plan Note (Addendum)
 Tetanus 2016 Flu d/w pt.  Encouraged.   PNA d/w pt.   Shingles d/w pt.   covid vaccine prev done.  D/w patient mz:neupnwd for colon cancer screening, including IFOB vs. Cologuard vs colonoscopy.  Risks and benefits were discussed and patient voiced understanding.   He opted for Cologuard, ordered 2025. Prostate cancer screening and PSA options (with potential risks and benefits of testing vs not testing) were discussed along with recent recs/guidelines.  He declined testing PSA at this point.  Reasonable to defer at age <55.  Living will d/w pt.  Wife designated if patient were incapacitated.   Diet and exercise d/w pt.

## 2024-04-19 NOTE — Assessment & Plan Note (Signed)
 He had labs done at work.  I asked him to get me a copy.  Defer labs now.   See above.  Continue amlodipine .

## 2024-04-19 NOTE — Assessment & Plan Note (Signed)
 Living will d/w pt.  Wife designated if patient were incapacitated.   ?
